# Patient Record
Sex: Female | Born: 1965
Health system: Southern US, Community
[De-identification: ages and names within clinical notes are randomized; demographics above are authoritative.]

## PROBLEM LIST (undated history)

## (undated) DIAGNOSIS — I10 Essential (primary) hypertension: Secondary | ICD-10-CM

## (undated) DIAGNOSIS — K219 Gastro-esophageal reflux disease without esophagitis: Secondary | ICD-10-CM

## (undated) DIAGNOSIS — R112 Nausea with vomiting, unspecified: Secondary | ICD-10-CM

## (undated) HISTORY — PX: TUBAL LIGATION: SHX77

## (undated) HISTORY — PX: CHOLECYSTECTOMY: SHX55

## (undated) HISTORY — PX: WISDOM TOOTH EXTRACTION: SHX21

---

## 1998-10-27 ENCOUNTER — Other Ambulatory Visit: Admission: RE | Admit: 1998-10-27 | Discharge: 1998-10-27 | Payer: Self-pay | Admitting: Obstetrics and Gynecology

## 1999-11-20 ENCOUNTER — Other Ambulatory Visit: Admission: RE | Admit: 1999-11-20 | Discharge: 1999-11-20 | Payer: Self-pay | Admitting: Obstetrics and Gynecology

## 2000-12-21 ENCOUNTER — Other Ambulatory Visit: Admission: RE | Admit: 2000-12-21 | Discharge: 2000-12-21 | Payer: Self-pay | Admitting: Obstetrics and Gynecology

## 2002-02-07 ENCOUNTER — Other Ambulatory Visit: Admission: RE | Admit: 2002-02-07 | Discharge: 2002-02-07 | Payer: Self-pay | Admitting: Obstetrics and Gynecology

## 2003-03-27 ENCOUNTER — Other Ambulatory Visit: Admission: RE | Admit: 2003-03-27 | Discharge: 2003-03-27 | Payer: Self-pay | Admitting: Obstetrics and Gynecology

## 2004-07-31 ENCOUNTER — Other Ambulatory Visit: Admission: RE | Admit: 2004-07-31 | Discharge: 2004-07-31 | Payer: Self-pay | Admitting: Obstetrics and Gynecology

## 2004-08-24 ENCOUNTER — Encounter: Admission: RE | Admit: 2004-08-24 | Discharge: 2004-08-24 | Payer: Self-pay | Admitting: Obstetrics and Gynecology

## 2008-04-23 ENCOUNTER — Ambulatory Visit (HOSPITAL_COMMUNITY): Admission: RE | Admit: 2008-04-23 | Discharge: 2008-04-23 | Payer: Self-pay | Admitting: Obstetrics and Gynecology

## 2010-05-14 LAB — CBC
HCT: 39 % (ref 36.0–46.0)
Hemoglobin: 13.2 g/dL (ref 12.0–15.0)
MCHC: 33.9 g/dL (ref 30.0–36.0)
MCV: 93.8 fL (ref 78.0–100.0)
Platelets: 205 10*3/uL (ref 150–400)
RBC: 4.16 MIL/uL (ref 3.87–5.11)
RDW: 12 % (ref 11.5–15.5)
WBC: 6.9 10*3/uL (ref 4.0–10.5)

## 2010-05-14 LAB — BASIC METABOLIC PANEL
BUN: 9 mg/dL (ref 6–23)
CO2: 28 mEq/L (ref 19–32)
Calcium: 9.3 mg/dL (ref 8.4–10.5)
Chloride: 98 mEq/L (ref 96–112)
Creatinine, Ser: 0.63 mg/dL (ref 0.4–1.2)
GFR calc Af Amer: >60 mL/min (ref >60)
GFR calc non Af Amer: >60 mL/min (ref >60)
Glucose, Bld: 98 mg/dL (ref 70–99)
Potassium: 4.1 mEq/L (ref 3.5–5.1)
Sodium: 133 mEq/L — ABNORMAL LOW (ref 135–145)

## 2010-05-14 LAB — PREGNANCY, URINE: Preg Test, Ur: NEGATIVE

## 2010-06-16 NOTE — H&P (Signed)
NAME:  Jordan Gates, Jordan Gates            ACCOUNT NO.:  [id number]   MEDICAL RECORD NO.:  [id number]          PATIENT TYPE:  AMB   LOCATION:  SDC                           FACILITY:  [facility]   PHYSICIAN:  Duke Salvia. Marcelle Overlie, M.D.DATE OF BIRTH:  1965-07-20   DATE OF ADMISSION:  04/23/2008  DATE OF DISCHARGE:                              HISTORY & PHYSICAL   CHIEF COMPLAINT:  Request permanent sterilization.   HISTORY OF PRESENT ILLNESS:  A 45 year old G2, P2 who recently had a  Mirena IUD removed in the office at the time of hysteroscopy with the  intent to perform Essure at the same time.  It was difficult getting the  IUD out, and there was not good visualization of either tubal ostia  precluding the Essure procedure.  She was given the choice of postponing  the Essure for a different attempt or proceeding with LTC which she  would prefer.  This procedure including the permanence, risks related to  bleeding, infection, and adjacent organ injury with possible need for  open or additional surgery along with the failure rate reviewed which  she understands and accepts.   ALLERGIES:  PENICILLIN.   MEDICATIONS:  Diovan.   OBSTETRICAL HISTORY:  Two vaginal deliveries.   FAMILY HISTORY:  Significant for hypertension and breast cancer.   SOCIAL HISTORY:  Denied smoking or alcohol use.  She is married.  Her  primary doctor is [facility].   PHYSICAL EXAMINATION:  Temp 98.2, blood pressure 108/80.  HEENT:  Unremarkable.  NECK:  Supple without masses.  LUNGS:  Clear.  CARDIOVASCULAR:  Regular rate and rhythm without murmurs, rubs or  gallops.  BREASTS:  Without masses.  ABDOMEN:  Soft, flat, nontender.  PELVIC:  Normal external genitalia.  Vagina and cervix are clear.  Uterus is midposition and normal size.  Adnexa negative.   IMPRESSION:  1. Normal exam, unable to perform Essure procedure.  2. Recent removal of intrauterine device.   PLAN:  Filshie clip tubal procedure  and risks reviewed as above.      Richard M. Marcelle Overlie, M.D.  Electronically Signed     [facility]/MEDQ  D:  04/22/2008  T:  04/22/2008  Job:  [id number]

## 2010-06-16 NOTE — Op Note (Signed)
NAME:  Jordan Gates, Jordan Gates            ACCOUNT NO.:  [id number]   MEDICAL RECORD NO.:  [id number]          PATIENT TYPE:  AMB   LOCATION:  SDC                           FACILITY:  [facility]   PHYSICIAN:  Duke Salvia. Marcelle Overlie, M.D.DATE OF BIRTH:  1965/09/11   DATE OF PROCEDURE:  04/23/2008  DATE OF DISCHARGE:                               OPERATIVE REPORT   PREOPERATIVE DIAGNOSIS:  Requests permanent sterilization.   POSTOPERATIVE DIAGNOSIS:  Requests permanent sterilization.   PROCEDURE:  Filshie clip tubal.   SURGEON:  Duke Salvia. Marcelle Overlie, MD   ANESTHESIA:  General endotracheal.   COMPLICATIONS:  None.   DRAINS:  In and out Foley catheter.   BLOOD LOSS:  Minimal.   SPECIMENS REMOVED:  None.   PROCEDURE AND FINDINGS:  The patient was taken to the operating room.  After an adequate level of general anesthesia was obtained with the  patient's legs in stirrups, the abdomen, the perineum, and vagina were  prepped and draped in the usual manner for laparoscopy.  The bladder was  drained, EUA carried out, uterus was in midposition, normal size,  mobile, adnexa negative.  Kahn cannula was positioned.  Attention  directed to the subumbilical area which was infiltrated with 0.5%  Marcaine plain.  A small incision was made.  The Veress needle was  introduced without difficulty; its intra-abdominal position verified by  pressure and water testing.  A 2.5 liter pneumoperitoneum was then  created.  Laparoscopic trocar and sleeve were then introduced without  difficulty.  There was no evidence of any bleeding or trauma.  Jordan Gates was  placed in Trendelenburg.  The uterus was anteflexed and the pelvic  findings were unremarkable.  A 0.5% Marcaine 4-5 mL were then dripped  across each tube from the cornu to the fimbriated end.  Filshie clip  applicator was back loaded, starting on the right, the Filshie clip was  applied 2 cm to 3 cm from the cornu at a right angle completely  engulfing the tube with  excellent application.  The exact same repeated  on the  opposite side after carefully identifying the tube.  The application was  photo documented.  Instruments were removed, gas allowed to escape.  Defects closed with 4-0 Dexon subcuticular suture.  Jordan Gates tolerated this  well, went to recovery room in good condition.       Richard M. Marcelle Overlie, M.D.  Electronically Signed     [facility]/MEDQ  D:  04/23/2008  T:  04/23/2008  Job:  [id number]

## 2011-02-23 ENCOUNTER — Other Ambulatory Visit: Payer: Self-pay | Admitting: Gastroenterology

## 2011-02-23 DIAGNOSIS — R1013 Epigastric pain: Secondary | ICD-10-CM

## 2011-03-08 ENCOUNTER — Encounter (HOSPITAL_COMMUNITY)
Admission: RE | Admit: 2011-03-08 | Discharge: 2011-03-08 | Disposition: A | Discharge status: Home / Self Care | Payer: BC Managed Care – PPO | Source: Ambulatory Visit | Attending: Gastroenterology | Admitting: Gastroenterology

## 2011-03-08 ENCOUNTER — Encounter (HOSPITAL_COMMUNITY): Payer: Self-pay | Admitting: Emergency Medicine

## 2011-03-08 ENCOUNTER — Emergency Department (HOSPITAL_COMMUNITY)
Admission: EM | Admit: 2011-03-08 | Discharge: 2011-03-09 | Disposition: A | Discharge status: Home / Self Care | Payer: BC Managed Care – PPO | Attending: Emergency Medicine | Admitting: Emergency Medicine

## 2011-03-08 ENCOUNTER — Other Ambulatory Visit: Payer: Self-pay | Admitting: Gastroenterology

## 2011-03-08 ENCOUNTER — Encounter (HOSPITAL_COMMUNITY): Payer: Self-pay

## 2011-03-08 DIAGNOSIS — R1013 Epigastric pain: Secondary | ICD-10-CM

## 2011-03-08 DIAGNOSIS — K819 Cholecystitis, unspecified: Secondary | ICD-10-CM | POA: Insufficient documentation

## 2011-03-08 DIAGNOSIS — R634 Abnormal weight loss: Secondary | ICD-10-CM | POA: Insufficient documentation

## 2011-03-08 DIAGNOSIS — Z79899 Other long term (current) drug therapy: Secondary | ICD-10-CM | POA: Insufficient documentation

## 2011-03-08 DIAGNOSIS — R110 Nausea: Secondary | ICD-10-CM | POA: Insufficient documentation

## 2011-03-08 DIAGNOSIS — R10816 Epigastric abdominal tenderness: Secondary | ICD-10-CM | POA: Insufficient documentation

## 2011-03-08 DIAGNOSIS — I10 Essential (primary) hypertension: Secondary | ICD-10-CM | POA: Insufficient documentation

## 2011-03-08 DIAGNOSIS — R112 Nausea with vomiting, unspecified: Secondary | ICD-10-CM | POA: Insufficient documentation

## 2011-03-08 HISTORY — DX: Essential (primary) hypertension: I10

## 2011-03-08 HISTORY — DX: Nausea with vomiting, unspecified: R11.2

## 2011-03-08 LAB — COMPREHENSIVE METABOLIC PANEL
ALT: 112 U/L — ABNORMAL HIGH (ref 0–35)
AST: 221 U/L — ABNORMAL HIGH (ref 0–37)
Albumin: 4.1 g/dL (ref 3.5–5.2)
Alkaline Phosphatase: 123 U/L — ABNORMAL HIGH (ref 39–117)
BUN: 9 mg/dL (ref 6–23)
CO2: 25 mEq/L (ref 19–32)
Calcium: 9.4 mg/dL (ref 8.4–10.5)
Chloride: 99 mEq/L (ref 96–112)
Creatinine, Ser: 0.75 mg/dL (ref 0.50–1.10)
GFR calc Af Amer: >90 mL/min (ref >90)
GFR calc non Af Amer: >90 mL/min (ref >90)
Glucose, Bld: 124 mg/dL — ABNORMAL HIGH (ref 70–99)
Potassium: 3.5 mEq/L (ref 3.5–5.1)
Sodium: 136 mEq/L (ref 135–145)
Total Bilirubin: 0.8 mg/dL (ref 0.3–1.2)
Total Protein: 7.5 g/dL (ref 6.0–8.3)

## 2011-03-08 LAB — DIFFERENTIAL
Basophils Absolute: 0 10*3/uL (ref 0.0–0.1)
Basophils Relative: 0 % (ref 0–1)
Eosinophils Absolute: 0.1 10*3/uL (ref 0.0–0.7)
Eosinophils Relative: 1 % (ref 0–5)
Lymphocytes Relative: 15 % (ref 12–46)
Lymphs Abs: 1.5 10*3/uL (ref 0.7–4.0)
Monocytes Absolute: 0.8 10*3/uL (ref 0.1–1.0)
Monocytes Relative: 8 % (ref 3–12)
Neutro Abs: 7.8 10*3/uL — ABNORMAL HIGH (ref 1.7–7.7)
Neutrophils Relative %: 77 % (ref 43–77)

## 2011-03-08 LAB — CBC
HCT: 35.8 % — ABNORMAL LOW (ref 36.0–46.0)
Hemoglobin: 12.3 g/dL (ref 12.0–15.0)
MCH: 29.8 pg (ref 26.0–34.0)
MCHC: 34.4 g/dL (ref 30.0–36.0)
MCV: 86.7 fL (ref 78.0–100.0)
Platelets: 192 10*3/uL (ref 150–400)
RBC: 4.13 MIL/uL (ref 3.87–5.11)
RDW: 12.3 % (ref 11.5–15.5)
WBC: 10.1 10*3/uL (ref 4.0–10.5)

## 2011-03-08 IMAGING — US US ABDOMEN COMPLETE
1 series · 13 of 25 positions shown · non-contrast
Comparison: None

CLINICAL DATA: History of epigastric abdominal pain and weight
loss and hypertension.

ABDOMINAL ULTRASOUND COMPLETE

[Series 1: us abdomen complete · 0.30mm/px · 13 of 81 slices shown]
[im 1/81]
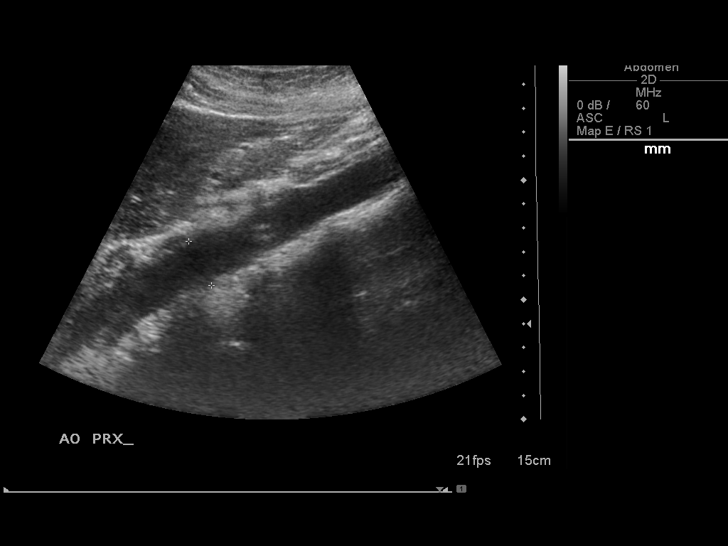
[im 7/81]
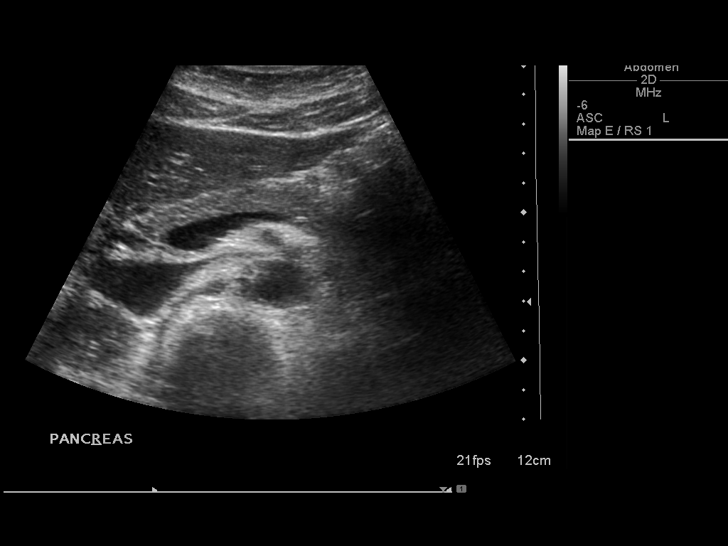
[im 14/81]
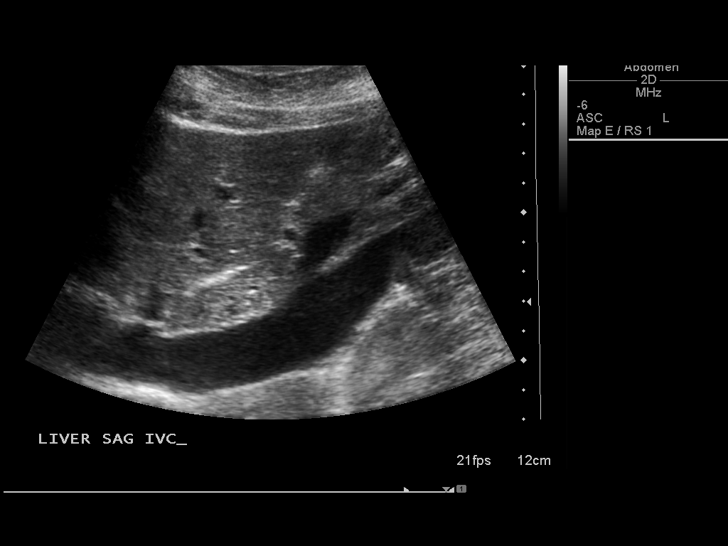
[im 21/81]
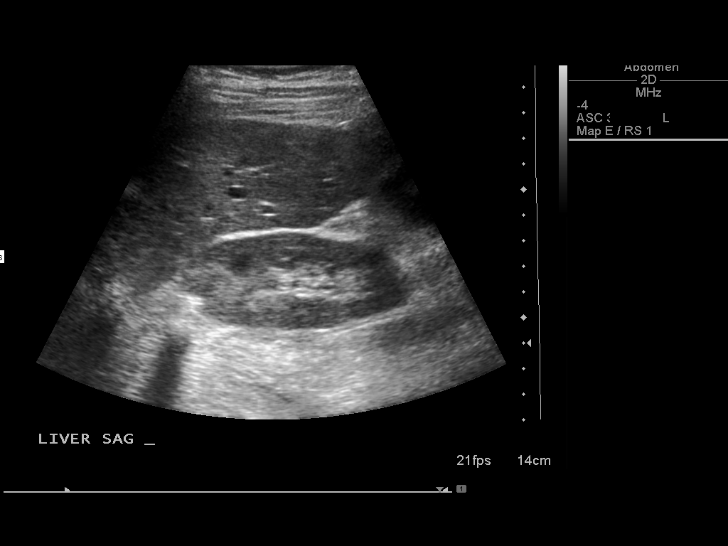
[im 27/81]
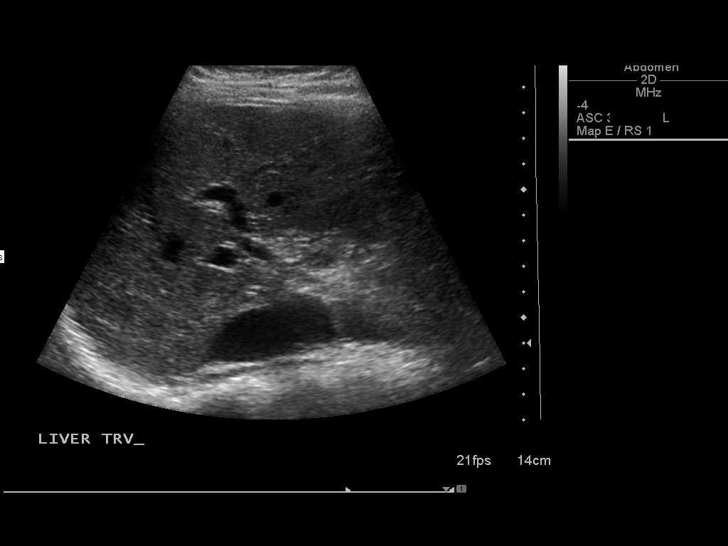
[im 34/81]
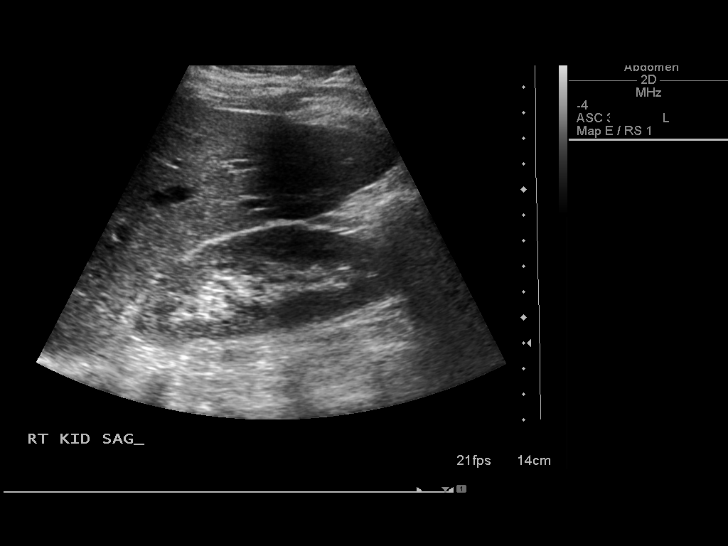
[im 41/81]
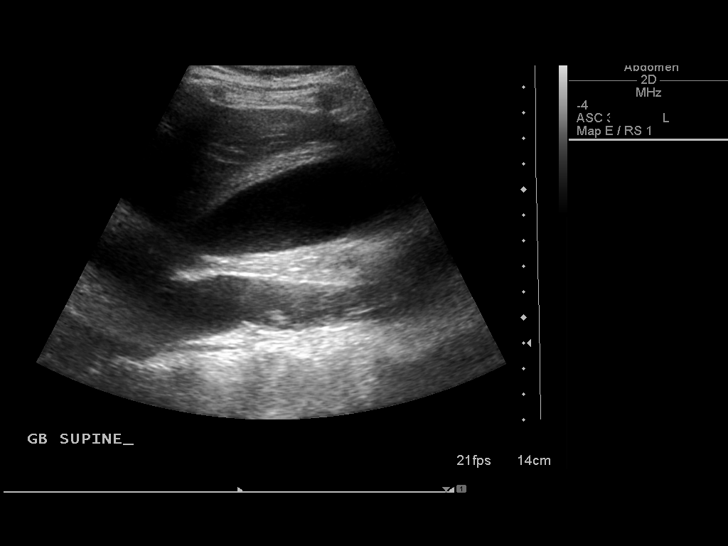
[im 47/81]
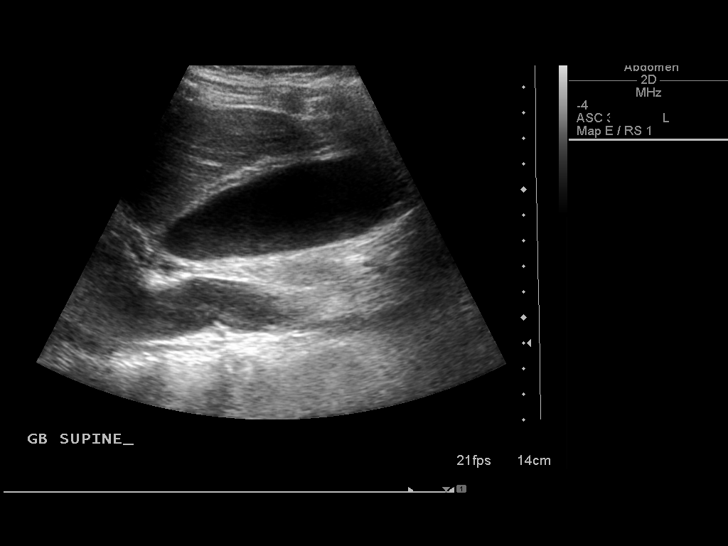
[im 54/81]
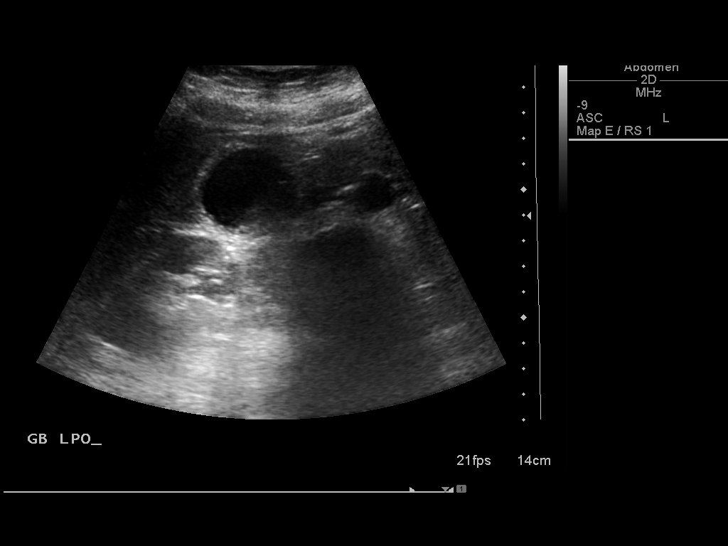
[im 61/81]
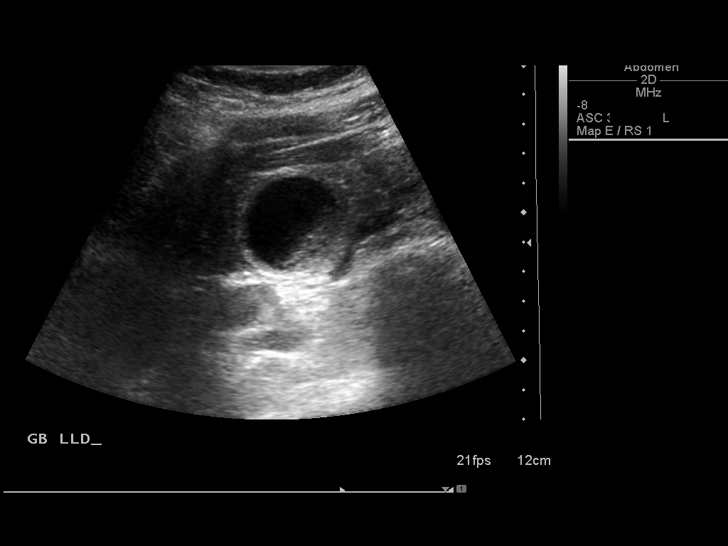
[im 67/81]
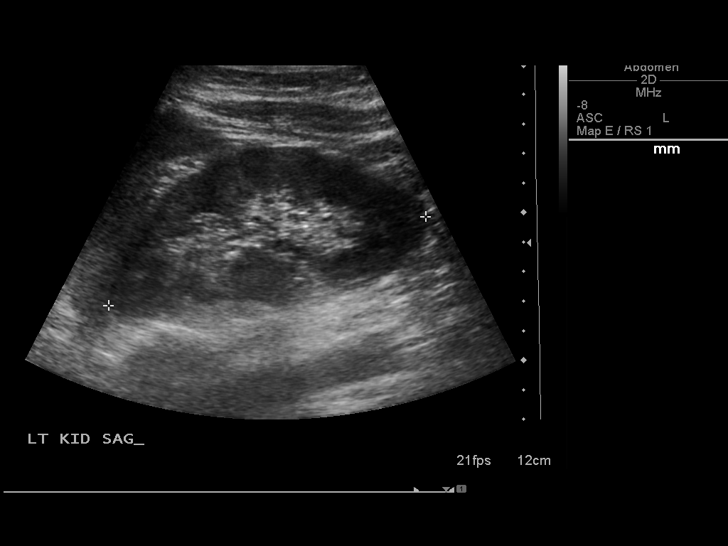
[im 74/81]
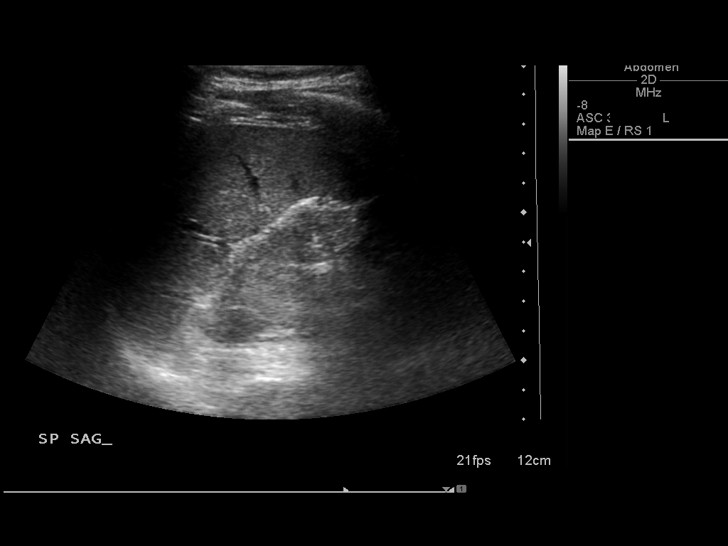
[im 81/81]
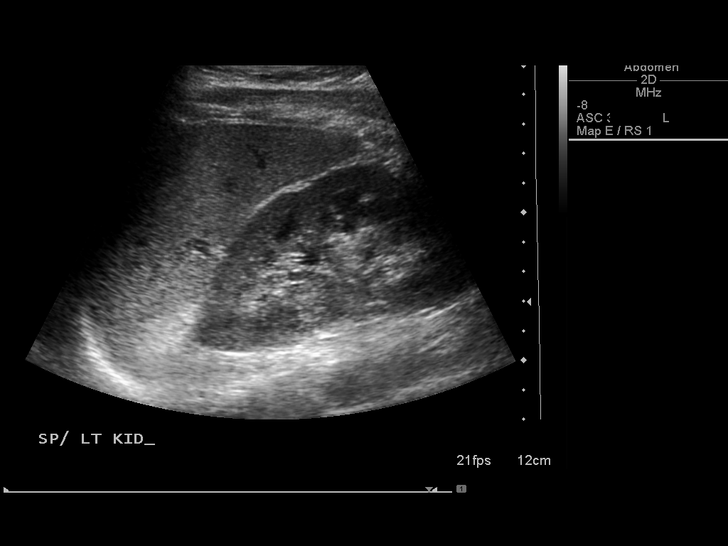

[13 of 25 positions shown; findings below may reference images not displayed]

FINDINGS: Gallbladder: There is echogenic sludge within the gallbladder.
Small hyperechoic echogenic areas are seen within the sludge and
there is suspicion that these may reflect very tiny calculi within
the sludge but no discrete acoustic shadowing posterior to the
small areas of hyperechogenicity was demonstrated to be certain
that calculi are present. There is gallbladder wall thickening.
There is no evidence of pericholecystic fluid. The gallbladder wall
thickness measured 6.5 mm. No positive sonographic Murphy's sign
according to the ultrasound technologist.

CBD: Normal in caliber measuring 5.1 mm. No choledocholithiasis is
evident.

Liver:  Normal size and echotexture without focal parenchymal
abnormality.

IVC:  Patent throughout its visualized course in the abdomen.

Pancreas:  Although the pancreas is difficult to visualize in its
entirety, no focal pancreatic abnormality is identified.

Spleen:  Normal size and echotexture without focal abnormality.
Length is 9 cm.

Right kidney:  No hydronephrosis.  Well-preserved cortex.  Normal
parenchymal echotexture without focal abnormalities.  Right renal
length is 11.1 cm.

Left kidney:  No hydronephrosis.  Well-preserved cortex.  Normal
parenchymal echotexture without focal abnormalities.  Left renal
length is 11.1 cm.

Aorta:  Maximum diameter is 2.1 cm.  No aneurysm is evident.

Ascites:  None.
IMPRESSION: There is thickening of the gallbladder wall.  There was no
positive sonographic Murphy's sign according to the ultrasound
technologist. No pericholecystic fluid is evident.  Echogenic
sludge is present within the gallbladder. Small hyperechoic
echogenic areas are seen within the sludge and there is suspicion
that these may reflect very tiny calculi within the sludge but no
discrete acoustic shadowing posterior to the small areas of
hyperechogenicity was demonstrated to be certain that calculi are
present.

## 2011-03-08 IMAGING — NM NM HEPATOBILIARY IMAGE, INC GB
1 series · 24 of 24 positions shown · non-contrast
Comparison: Ultrasound dated 03/08/2011

CLINICAL DATA: Epigastric pain. Nausea and vomiting.

NUCLEAR MEDICINE HEPATOHBILIARY INCLUDE GB
Radiopharmaceutical: 520 mCi technetium 99m Choletec.
2.7 mg morphine IV

[Series 1: antr · 4.46mm/px · 4 acquisitions, 24 frames shown]
[im 1/4]
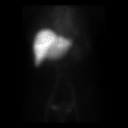
[im 1/4]
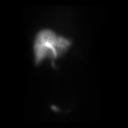
[im 1/4]
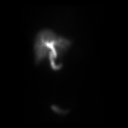
[im 1/4]
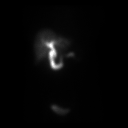
[im 1/4]
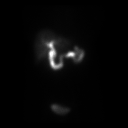
[im 1/4]
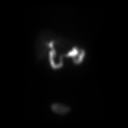
[im 2/4]
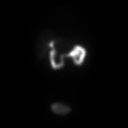
[im 2/4]
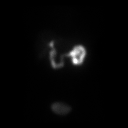
[im 2/4]
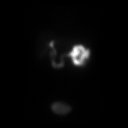
[im 2/4]
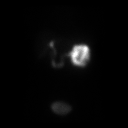
[im 2/4]
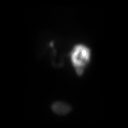
[im 2/4]
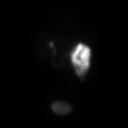
[im 3/4]
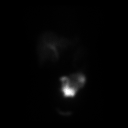
[im 3/4]
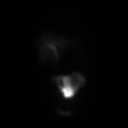
[im 3/4]
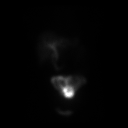
[im 3/4]
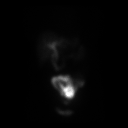
[im 3/4]
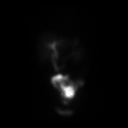
[im 3/4]
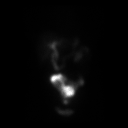
[im 4/4]
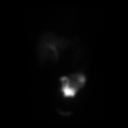
[im 4/4]
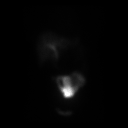
[im 4/4]
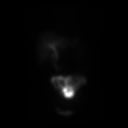
[im 4/4]
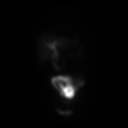
[im 4/4]
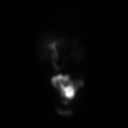
[im 4/4]
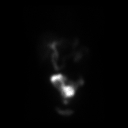

[24 of 24 positions shown; findings below may reference images not displayed]

FINDINGS: The patient was imaged for 90 minutes and activity was
seen in the bile ducts and duodenum at 15 minutes.  The gallbladder
was not visualized at 90 minutes.  The patient was then given
mg of morphine IV.  The gallbladder was not visualized at an
additional 30 minutes.
IMPRESSION: Abnormal hepatobiliary scan with no activity in the gallbladder
over a 2 hour time interval even after IV morphine. The findings
are consistent with cholecystitis, most likely acute.  Chronic
cholecystitis can sometimes also give this appearance.

## 2011-03-08 MED ORDER — MORPHINE SULFATE 4 MG/ML IJ SOLN
2.7000 mg | Freq: Once | INTRAMUSCULAR | Status: AC
  Administered 2011-03-08: 2.7 mg via INTRAVENOUS
  Filled 2011-03-08: qty 1

## 2011-03-08 MED ORDER — TECHNETIUM TC 99M MEBROFENIN IV KIT
5.8000 | PACK | Freq: Once | INTRAVENOUS | Status: AC | PRN
  Administered 2011-03-08: 5.8 via INTRAVENOUS

## 2011-03-08 MED ORDER — MORPHINE SULFATE 4 MG/ML IJ SOLN
4.0000 mg | Freq: Once | INTRAMUSCULAR | Status: AC
  Administered 2011-03-09: 4 mg via INTRAVENOUS
  Filled 2011-03-08: qty 1

## 2011-03-08 MED ORDER — SODIUM CHLORIDE 0.9 % IV BOLUS (SEPSIS)
1000.0000 mL | Freq: Once | INTRAVENOUS | Status: AC
  Administered 2011-03-09: 1000 mL via INTRAVENOUS

## 2011-03-08 NOTE — ED Notes (Signed)
Pt states she is having sharp pain in her midepigastric area that is radiating through to her back  Pt states she had 2 procedures today and has an appt with Dr Abbey Chatters tomorrow but the pain is worse tonight and she is unable to wait til tomorrow  Pt states the scan she had today shows her gallbladder is not functioning

## 2011-03-09 ENCOUNTER — Ambulatory Visit (INDEPENDENT_AMBULATORY_CARE_PROVIDER_SITE_OTHER): Payer: Self-pay | Admitting: General Surgery

## 2011-03-09 ENCOUNTER — Other Ambulatory Visit (INDEPENDENT_AMBULATORY_CARE_PROVIDER_SITE_OTHER): Payer: Self-pay | Admitting: General Surgery

## 2011-03-09 DIAGNOSIS — K819 Cholecystitis, unspecified: Secondary | ICD-10-CM

## 2011-03-09 DIAGNOSIS — R945 Abnormal results of liver function studies: Secondary | ICD-10-CM

## 2011-03-09 DIAGNOSIS — R1013 Epigastric pain: Secondary | ICD-10-CM

## 2011-03-09 LAB — URINALYSIS, ROUTINE W REFLEX MICROSCOPIC
Bilirubin Urine: NEGATIVE
Glucose, UA: NEGATIVE mg/dL
Hgb urine dipstick: NEGATIVE
Ketones, ur: 15 mg/dL — AB
Leukocytes, UA: NEGATIVE
Nitrite: NEGATIVE
Protein, ur: NEGATIVE mg/dL
Specific Gravity, Urine: 1.016 (ref 1.005–1.030)
Urobilinogen, UA: 1 mg/dL (ref 0.0–1.0)
pH: 6 (ref 5.0–8.0)

## 2011-03-09 LAB — PROTIME-INR
INR: 1.05 (ref 0.00–1.49)
Prothrombin Time: 13.9 seconds (ref 11.6–15.2)

## 2011-03-09 LAB — LACTIC ACID, PLASMA: Lactic Acid, Venous: 0.9 mmol/L (ref 0.5–2.2)

## 2011-03-09 LAB — POCT PREGNANCY, URINE: Preg Test, Ur: NEGATIVE

## 2011-03-09 LAB — LIPASE, BLOOD: Lipase: 48 U/L (ref 11–59)

## 2011-03-09 MED ORDER — OXYCODONE-ACETAMINOPHEN 5-325 MG PO TABS: 2.0000 | ORAL_TABLET | Freq: Once | ORAL | Status: DC

## 2011-03-09 MED ORDER — ONDANSETRON 8 MG PO TBDP: 8.0000 mg | ORAL_TABLET | Freq: Three times a day (TID) | ORAL | Status: AC | PRN

## 2011-03-09 MED ORDER — OXYCODONE-ACETAMINOPHEN 5-325 MG PO TABS: 2.0000 | ORAL_TABLET | Freq: Four times a day (QID) | ORAL | Status: AC | PRN

## 2011-03-09 NOTE — ED Provider Notes (Signed)
History     CSN: [id number]  Arrival date & time 03/08/11  2039   First MD Initiated Contact with Patient 03/08/11 2304      Chief Complaint  Patient presents with  . Abdominal Pain    (Consider location/radiation/quality/duration/timing/severity/associated sxs/prior treatment) HPI Patient is a 46 yo F who presents complaining of 6/10 epigastric pain that radiates to the RUQ and back.  She has been suffering intermittent episodes of this for the past few weeks.  This is worse with eating.  She was seen today for a HIDA scan ordered by her physician to evaluate for cholecystitis.  She has a consultation appointment with Dr. Purnell Shoemaker of [facility] tomorrow for this.  Study showed findings consistent with cholecystitis.  She has been afebrile and nauseas but is not vomiting.  She denies She has history of tubal ligation and no other abdominal surgeries.  The pain is worse with palpation.  There are no other associated or modifying factors. Past Medical History  Diagnosis Date  . N&V (nausea and vomiting)     n/v  epigastric pain  . Hypertension     Past Surgical History  Procedure Date  . Tubal ligation     Family History  Problem Relation Age of Onset  . Hypertension Mother   . Cancer Mother     History  Substance Use Topics  . Smoking status: Never Smoker   . Smokeless tobacco: Not on file  . Alcohol Use: No    OB History    Grav Para Term Preterm Abortions TAB SAB Ect Mult Living                  Review of Systems  Constitutional: Negative.   HENT: Negative.   Eyes: Negative.   Respiratory: Negative.   Cardiovascular: Negative.   Gastrointestinal: Positive for nausea and abdominal pain.  Genitourinary: Negative.   Musculoskeletal: Negative.   Skin: Negative.   Neurological: Negative.   Hematological: Negative.   Psychiatric/Behavioral: Negative.   All other systems reviewed and are negative.    Allergies  Penicillins and Sulfa antibiotics  Home  Medications   Current Outpatient Rx  Name Route Sig Dispense Refill  . DEXLANSOPRAZOLE 60 MG PO CPDR Oral Take 60 mg by mouth daily.    Marland Kitchen ONE-DAILY MULTI VITAMINS PO TABS Oral Take 1 tablet by mouth daily.    Marland Kitchen VALSARTAN 160 MG PO TABS Oral Take 160 mg by mouth daily.      BP 134/49  Pulse 76  Temp(Src) 98.4 F (36.9 C) (Oral)  Resp 20  SpO2 96%  LMP 02/14/2011  Physical Exam  Nursing note and vitals reviewed. Constitutional: She is oriented to person, place, and time. She appears well-developed and well-nourished. No distress.       uncomfortable  HENT:  Head: Normocephalic and atraumatic.  Eyes: Conjunctivae and EOM are normal. Pupils are equal, round, and reactive to light.  Neck: Normal range of motion.  Cardiovascular: Normal rate, regular rhythm, normal heart sounds and intact distal pulses.  Exam reveals no gallop and no friction rub.   No murmur heard. Pulmonary/Chest: Effort normal and breath sounds normal. No respiratory distress. She has no wheezes. She has no rales.  Abdominal: Soft. Bowel sounds are normal. She exhibits no distension. There is tenderness. There is no rebound and no guarding.       TTP in the epigastrium that is moderate, no acute abdomen  Musculoskeletal: Normal range of motion. She exhibits no edema.  Neurological: She is alert and oriented to person, place, and time. No cranial nerve deficit. She exhibits normal muscle tone. Coordination normal.  Skin: Skin is warm and dry. No rash noted.  Psychiatric: She has a normal mood and affect.    ED Course  Procedures (including critical care time)  Labs Reviewed  CBC - Abnormal; Notable for the following:    HCT 35.8 (*)    All other components within normal limits  DIFFERENTIAL - Abnormal; Notable for the following:    Neutro Abs 7.8 (*)    All other components within normal limits  COMPREHENSIVE METABOLIC PANEL - Abnormal; Notable for the following:    Glucose, Bld 124 (*)    AST 221 (*)     ALT 112 (*)    Alkaline Phosphatase 123 (*)    All other components within normal limits  URINALYSIS, ROUTINE W REFLEX MICROSCOPIC - Abnormal; Notable for the following:    Ketones, ur 15 (*)    All other components within normal limits  PROTIME-INR  LIPASE, BLOOD  LACTIC ACID, PLASMA   Nm Hepatobiliary Liver Func  03/08/2011  *RADIOLOGY REPORT*  Clinical Data: Epigastric pain. Nausea and vomiting.  NUCLEAR MEDICINE HEPATOHBILIARY INCLUDE GB  Radiopharmaceutical: 520 mCi technetium 53m Choletec.  2.7 mg morphine IV  Comparison: Ultrasound dated 03/08/2011  Findings: The patient was imaged for 90 minutes and activity was seen in the bile ducts and duodenum at 15 minutes.  The gallbladder was not visualized at 90 minutes.  The patient was then given 2.7 mg of morphine IV.  The gallbladder was not visualized at an additional 30 minutes.  IMPRESSION: Abnormal hepatobiliary scan with no activity in the gallbladder over a 2 hour time interval even after IV morphine. The findings are consistent with cholecystitis, most likely acute.  Chronic cholecystitis can sometimes also give this appearance.  Original Report Authenticated By: Gwynn Burly, M.D.   US Abdomen Complete  03/08/2011  *RADIOLOGY REPORT*  Clinical Data:  History of epigastric abdominal pain and weight loss and hypertension.  ABDOMINAL ULTRASOUND COMPLETE  Comparison:  None  Findings:  Gallbladder: There is echogenic sludge within the gallbladder. Small hyperechoic echogenic areas are seen within the sludge and there is suspicion that these may reflect very tiny calculi within the sludge but no discrete acoustic shadowing posterior to the small areas of hyperechogenicity was demonstrated to be certain that calculi are present. There is gallbladder wall thickening. There is no evidence of pericholecystic fluid. The gallbladder wall thickness measured 6.5 mm. No positive sonographic Murphy's sign according to the ultrasound technologist.  CBD:  Normal in caliber measuring 5.1 mm. No choledocholithiasis is evident.  Liver:  Normal size and echotexture without focal parenchymal abnormality.  IVC:  Patent throughout its visualized course in the abdomen.  Pancreas:  Although the pancreas is difficult to visualize in its entirety, no focal pancreatic abnormality is identified.  Spleen:  Normal size and echotexture without focal abnormality. Length is 9 cm.  Right kidney:  No hydronephrosis.  Well-preserved cortex.  Normal parenchymal echotexture without focal abnormalities.  Right renal length is 11.1 cm.  Left kidney:  No hydronephrosis.  Well-preserved cortex.  Normal parenchymal echotexture without focal abnormalities.  Left renal length is 11.1 cm.  Aorta:  Maximum diameter is 2.1 cm.  No aneurysm is evident.  Ascites:  None.  IMPRESSION:   There is thickening of the gallbladder wall.  There was no positive sonographic Murphy's sign according to the ultrasound technologist. No  pericholecystic fluid is evident.  Echogenic sludge is present within the gallbladder. Small hyperechoic echogenic areas are seen within the sludge and there is suspicion that these may reflect very tiny calculi within the sludge but no discrete acoustic shadowing posterior to the small areas of hyperechogenicity was demonstrated to be certain that calculi are present.  Original Report Authenticated By: Crawford Givens, M.D.     1. Cholecystitis       MDM  Patient was evaluated and was hemodynamically stable without an acute abdomen.  Labs were ordered and IVF and a dose of morphine were given.  PAin resolved with this.  Patient labs showed no leukocytosis but she did have elevated transaminases with no history of liver abnormalities previously.  I spoke to Dr. Johna Sheriff who was on call for [facility].  He evaluated the patient here and wrote consult note indicating that he felt the patient did have a cholecystitis but that the acuity of this was debatable.  Patient will be scheduled for  surgery later in the week.  She was given strict precautions for reasons to return by myself including worsening pain and vomiting, fevers, inability to tolerate po.  She was discharged with pain and nausea medications.  Patient and family understood the plan and patient was discharged in good condition.        Cyndra Numbers, MD 03/09/11 [phone]

## 2011-03-09 NOTE — Consult Note (Signed)
Reason for Consult: Abdominal Pain Referring Physician: Suraiya Dickerson Altvater is an 46 y.o. female.  HPI: the patient is a 46 year old female who presents to the emergency room with acute abdominal pain. The patient has an intentional weight loss of 70 pounds over the end of 2012. At that time she began to experience intermittent epigastric abdominal pain. Initially this was not very frequent or severe. Over the past 4-6 weeks she has been experiencing increasingly frequent and severe episodes of abdominal pain. She has been evaluated by Dr. Ranae Palms.  She was seen 2 days ago after a self limited attack of pain. At that point an abdominal ultrasound and HIDA scan were obtained. As below her ultrasound showed significant thickening of the gallbladder wall and sludge or probable small stones. HIDA scan yesterday showed nonvisualization of the gallbladder. Last night she again had a fairly sudden onset of severe epigastric pain radiating through to her back associated with nausea. She then presented to the emergency room for evaluation. Lab work has shown mildly elevated LFTs as noted below. She received 1 dose of IV narcotics in the emergency room 6 hours ago and now her pain is completely relieved. I was asked to see the patient in consultation. She did have an appointment to be seen in our office today by Dr. Abbey Chatters.  She has not had any fever or chills. No dark urine or yellow color to her eyes or skin. She feels essentially well between episodes of pain. Bowel movements have been normal without melena or hematochezia.  Past Medical History  Diagnosis Date  . N&V (nausea and vomiting)     n/v  epigastric pain  . Hypertension     Past Surgical History  Procedure Date  . Tubal ligation     Family History  Problem Relation Age of Onset  . Hypertension Mother   . Cancer Mother     Social History:  reports that she has never smoked. She does not have any smokeless tobacco history on  file. She reports that she does not drink alcohol or use illicit drugs.  Allergies:  Allergies  Allergen Reactions  . Penicillins   . Sulfa Antibiotics     Current Facility-Administered Medications  Medication Dose Route Frequency Provider Last Rate Last Dose  . morphine 4 MG/ML injection 4 mg  4 mg Intravenous Once Cyndra Numbers, MD   4 mg at 03/09/11 0019  . oxyCODONE-acetaminophen (PERCOCET) 5-325 MG per tablet 2 tablet  2 tablet Oral Once Meagan Hunt, MD      . sodium chloride 0.9 % bolus 1,000 mL  1,000 mL Intravenous Once Cyndra Numbers, MD   1,000 mL at 03/09/11 0000   Current Outpatient Prescriptions  Medication Sig Dispense Refill  . dexlansoprazole (DEXILANT) 60 MG capsule Take 60 mg by mouth daily.      . Multiple Vitamin (MULTIVITAMIN) tablet Take 1 tablet by mouth daily.      . valsartan (DIOVAN) 160 MG tablet Take 160 mg by mouth daily.      . ondansetron (ZOFRAN ODT) 8 MG disintegrating tablet Take 1 tablet (8 mg total) by mouth every 8 (eight) hours as needed for nausea.  20 tablet  0  . oxyCODONE-acetaminophen (PERCOCET) 5-325 MG per tablet Take 2 tablets by mouth every 6 (six) hours as needed for pain.  30 tablet  0   Facility-Administered Medications Ordered in Other Encounters  Medication Dose Route Frequency Provider Last Rate Last Dose  . morphine 4  MG/ML injection 2.7 mg  2.7 mg Intravenous Once Gerrianne Scale, MD   2.7 mg at 03/08/11 1245  . technetium TC 58M mebrofenin (CHOLETEC) injection 6 milli Curie  6 milli Curie Intravenous Once PRN Medication Radiologist, MD   5.8 milli Curie at 03/08/11 1000     Results for orders placed during the hospital encounter of 03/08/11 (from the past 48 hour(s))  CBC     Status: Abnormal   Collection Time   03/08/11 10:50 PM      Component Value Range Comment   WBC 10.1  4.0 - 10.5 (K/uL)    RBC 4.13  3.87 - 5.11 (MIL/uL)    Hemoglobin 12.3  12.0 - 15.0 (g/dL)    HCT 16.1 (*) 09.6 - 46.0 (%)    MCV 86.7  78.0 - 100.0 (fL)      MCH 29.8  26.0 - 34.0 (pg)    MCHC 34.4  30.0 - 36.0 (g/dL)    RDW 04.5  40.9 - 81.1 (%)    Platelets 192  150 - 400 (K/uL)   DIFFERENTIAL     Status: Abnormal   Collection Time   03/08/11 10:50 PM      Component Value Range Comment   Neutrophils Relative 77  43 - 77 (%)    Neutro Abs 7.8 (*) 1.7 - 7.7 (K/uL)    Lymphocytes Relative 15  12 - 46 (%)    Lymphs Abs 1.5  0.7 - 4.0 (K/uL)    Monocytes Relative 8  3 - 12 (%)    Monocytes Absolute 0.8  0.1 - 1.0 (K/uL)    Eosinophils Relative 1  0 - 5 (%)    Eosinophils Absolute 0.1  0.0 - 0.7 (K/uL)    Basophils Relative 0  0 - 1 (%)    Basophils Absolute 0.0  0.0 - 0.1 (K/uL)   COMPREHENSIVE METABOLIC PANEL     Status: Abnormal   Collection Time   03/08/11 10:50 PM      Component Value Range Comment   Sodium 136  135 - 145 (mEq/L)    Potassium 3.5  3.5 - 5.1 (mEq/L)    Chloride 99  96 - 112 (mEq/L)    CO2 25  19 - 32 (mEq/L)    Glucose, Bld 124 (*) 70 - 99 (mg/dL)    BUN 9  6 - 23 (mg/dL)    Creatinine, Ser 9.14  0.50 - 1.10 (mg/dL)    Calcium 9.4  8.4 - 10.5 (mg/dL)    Total Protein 7.5  6.0 - 8.3 (g/dL)    Albumin 4.1  3.5 - 5.2 (g/dL)    AST 782 (*) 0 - 37 (U/L)    ALT 112 (*) 0 - 35 (U/L)    Alkaline Phosphatase 123 (*) 39 - 117 (U/L)    Total Bilirubin 0.8  0.3 - 1.2 (mg/dL)    GFR calc non Af Amer >90  >90 (mL/min)    GFR calc Af Amer >90  >90 (mL/min)   PROTIME-INR     Status: Normal   Collection Time   03/08/11 10:50 PM      Component Value Range Comment   Prothrombin Time 13.9  11.6 - 15.2 (seconds)    INR 1.05  0.00 - 1.49    LIPASE, BLOOD     Status: Normal   Collection Time   03/08/11 10:59 PM      Component Value Range Comment   Lipase 48  11 - 59 (  U/L)   LACTIC ACID, PLASMA     Status: Normal   Collection Time   03/08/11 11:30 PM      Component Value Range Comment   Lactic Acid, Venous 0.9  0.5 - 2.2 (mmol/L)   URINALYSIS, ROUTINE W REFLEX MICROSCOPIC     Status: Abnormal   Collection Time   03/08/11 11:51 PM       Component Value Range Comment   Color, Urine YELLOW  YELLOW     APPearance CLEAR  CLEAR     Specific Gravity, Urine 1.016  1.005 - 1.030     pH 6.0  5.0 - 8.0     Glucose, UA NEGATIVE  NEGATIVE (mg/dL)    Hgb urine dipstick NEGATIVE  NEGATIVE     Bilirubin Urine NEGATIVE  NEGATIVE     Ketones, ur 15 (*) NEGATIVE (mg/dL)    Protein, ur NEGATIVE  NEGATIVE (mg/dL)    Urobilinogen, UA 1.0  0.0 - 1.0 (mg/dL)    Nitrite NEGATIVE  NEGATIVE     Leukocytes, UA NEGATIVE  NEGATIVE  MICROSCOPIC NOT DONE ON URINES WITH NEGATIVE PROTEIN, BLOOD, LEUKOCYTES, NITRITE, OR GLUCOSE <1000 mg/dL.    Nm Hepatobiliary Liver Func  03/08/2011  *RADIOLOGY REPORT*  Clinical Data: Epigastric pain. Nausea and vomiting.  NUCLEAR MEDICINE HEPATOHBILIARY INCLUDE GB  Radiopharmaceutical: 520 mCi technetium 71m Choletec.  2.7 mg morphine IV  Comparison: Ultrasound dated 03/08/2011  Findings: The patient was imaged for 90 minutes and activity was seen in the bile ducts and duodenum at 15 minutes.  The gallbladder was not visualized at 90 minutes.  The patient was then given 2.7 mg of morphine IV.  The gallbladder was not visualized at an additional 30 minutes.  IMPRESSION: Abnormal hepatobiliary scan with no activity in the gallbladder over a 2 hour time interval even after IV morphine. The findings are consistent with cholecystitis, most likely acute.  Chronic cholecystitis can sometimes also give this appearance.  Original Report Authenticated By: Gwynn Burly, M.D.   US Abdomen Complete  03/08/2011  *RADIOLOGY REPORT*  Clinical Data:  History of epigastric abdominal pain and weight loss and hypertension.  ABDOMINAL ULTRASOUND COMPLETE  Comparison:  None  Findings:  Gallbladder: There is echogenic sludge within the gallbladder. Small hyperechoic echogenic areas are seen within the sludge and there is suspicion that these may reflect very tiny calculi within the sludge but no discrete acoustic shadowing posterior to the  small areas of hyperechogenicity was demonstrated to be certain that calculi are present. There is gallbladder wall thickening. There is no evidence of pericholecystic fluid. The gallbladder wall thickness measured 6.5 mm. No positive sonographic Murphy's sign according to the ultrasound technologist.  CBD: Normal in caliber measuring 5.1 mm. No choledocholithiasis is evident.  Liver:  Normal size and echotexture without focal parenchymal abnormality.  IVC:  Patent throughout its visualized course in the abdomen.  Pancreas:  Although the pancreas is difficult to visualize in its entirety, no focal pancreatic abnormality is identified.  Spleen:  Normal size and echotexture without focal abnormality. Length is 9 cm.  Right kidney:  No hydronephrosis.  Well-preserved cortex.  Normal parenchymal echotexture without focal abnormalities.  Right renal length is 11.1 cm.  Left kidney:  No hydronephrosis.  Well-preserved cortex.  Normal parenchymal echotexture without focal abnormalities.  Left renal length is 11.1 cm.  Aorta:  Maximum diameter is 2.1 cm.  No aneurysm is evident.  Ascites:  None.  IMPRESSION:   There is thickening of the  gallbladder wall.  There was no positive sonographic Murphy's sign according to the ultrasound technologist. No pericholecystic fluid is evident.  Echogenic sludge is present within the gallbladder. Small hyperechoic echogenic areas are seen within the sludge and there is suspicion that these may reflect very tiny calculi within the sludge but no discrete acoustic shadowing posterior to the small areas of hyperechogenicity was demonstrated to be certain that calculi are present.  Original Report Authenticated By: Crawford Givens, M.D.    Review of Systems  Constitutional: Positive for weight loss. Negative for fever and chills.  HENT: Negative.   Respiratory: Negative for cough, sputum production and shortness of breath.   Cardiovascular: Negative for chest pain and palpitations.   Gastrointestinal: Positive for nausea, vomiting and abdominal pain. Negative for heartburn, diarrhea, constipation and blood in stool.  Genitourinary: Negative for dysuria, urgency and frequency.  Musculoskeletal: Negative for myalgias and joint pain.  Endo/Heme/Allergies: Does not bruise/bleed easily.   Blood pressure 107/66, pulse 50, temperature 97.9 F (36.6 C), temperature source Oral, resp. rate 20, last menstrual period 02/14/2011, SpO2 96.00%. Physical Exam General: Alert, well-developed Caucasian female, in no distress Skin: Warm and dry without rash or infection. HEENT: No palpable masses or thyromegaly. Sclera nonicteric. Pupils equal round and reactive. Oropharynx clear. Lymph nodes: No cervical, supraclavicular, or inguinal nodes palpable. Lungs: Breath sounds clear and equal without increased work of breathing Cardiovascular: Regular rate and rhythm without murmur. No JVD or edema. Peripheral pulses intact. Abdomen: Nondistended. Soft with minimal epigastric tenderness to deep palpation and no guarding.. No masses palpable. No organomegaly. No palpable hernias. Extremities: No edema or joint swelling or deformity. No chronic venous stasis changes. Neurologic: Alert and fully oriented..  Assessment/Plan: Repeated and worsening episodes of epigastric abdominal pain. Gallbladder ultrasound shows significant wall thickening and probable small stones. HIDA was positive for nonvisualization of the gallbladder. She has mildly elevated LFTs consistent with inflammatory change versus less likely common bile duct stones. Currently the patient is completely asymptomatic. I suspect that her HIDA scan result is due to significant chronic cholecystitis rather than acute cholecystitis as she does not have any significant pain or tenderness currently and no fever or elevated white blood count. We discussed options of immediate admission versus urgent elective cholecystectomy. As she is currently  feeling well we will plan to let her go home and we will arrange outpatient laparoscopic cholecystectomy for later this week. We discussed the nature of the procedure and its indications as well as risks of bleeding, infection, bile leak, bile duct injury and possible need for open procedure.  Jerrelle Michelsen T 03/09/2011, 4:41 AM

## 2011-03-10 ENCOUNTER — Ambulatory Visit (HOSPITAL_COMMUNITY)
Admission: RE | Admit: 2011-03-10 | Discharge: 2011-03-10 | Disposition: A | Discharge status: Home / Self Care | Payer: BC Managed Care – PPO | Source: Ambulatory Visit | Attending: General Surgery | Admitting: General Surgery

## 2011-03-10 ENCOUNTER — Other Ambulatory Visit: Payer: Self-pay

## 2011-03-10 ENCOUNTER — Encounter (HOSPITAL_COMMUNITY)
Admission: RE | Disposition: A | Discharge status: Home / Self Care | Payer: Self-pay | Source: Ambulatory Visit | Attending: General Surgery

## 2011-03-10 ENCOUNTER — Ambulatory Visit (HOSPITAL_COMMUNITY): Payer: BC Managed Care – PPO | Admitting: Anesthesiology

## 2011-03-10 ENCOUNTER — Ambulatory Visit (HOSPITAL_COMMUNITY): Payer: BC Managed Care – PPO

## 2011-03-10 ENCOUNTER — Encounter (HOSPITAL_COMMUNITY): Payer: Self-pay | Admitting: Anesthesiology

## 2011-03-10 ENCOUNTER — Encounter (HOSPITAL_COMMUNITY): Payer: Self-pay | Admitting: *Deleted

## 2011-03-10 DIAGNOSIS — R109 Unspecified abdominal pain: Secondary | ICD-10-CM | POA: Insufficient documentation

## 2011-03-10 DIAGNOSIS — K824 Cholesterolosis of gallbladder: Secondary | ICD-10-CM

## 2011-03-10 DIAGNOSIS — K8010 Calculus of gallbladder with chronic cholecystitis without obstruction: Secondary | ICD-10-CM

## 2011-03-10 DIAGNOSIS — Z79899 Other long term (current) drug therapy: Secondary | ICD-10-CM | POA: Insufficient documentation

## 2011-03-10 DIAGNOSIS — I10 Essential (primary) hypertension: Secondary | ICD-10-CM | POA: Insufficient documentation

## 2011-03-10 HISTORY — PX: CHOLECYSTECTOMY: SHX55

## 2011-03-10 LAB — SURGICAL PCR SCREEN
MRSA, PCR: NEGATIVE
Staphylococcus aureus: NEGATIVE

## 2011-03-10 IMAGING — CR DG CHEST 2V
2 series · 2 of 2 positions shown · non-contrast
Comparison: None.

CLINICAL DATA: Preop lap cholecystectomy

CHEST - 2 VIEW

[w chest pa]
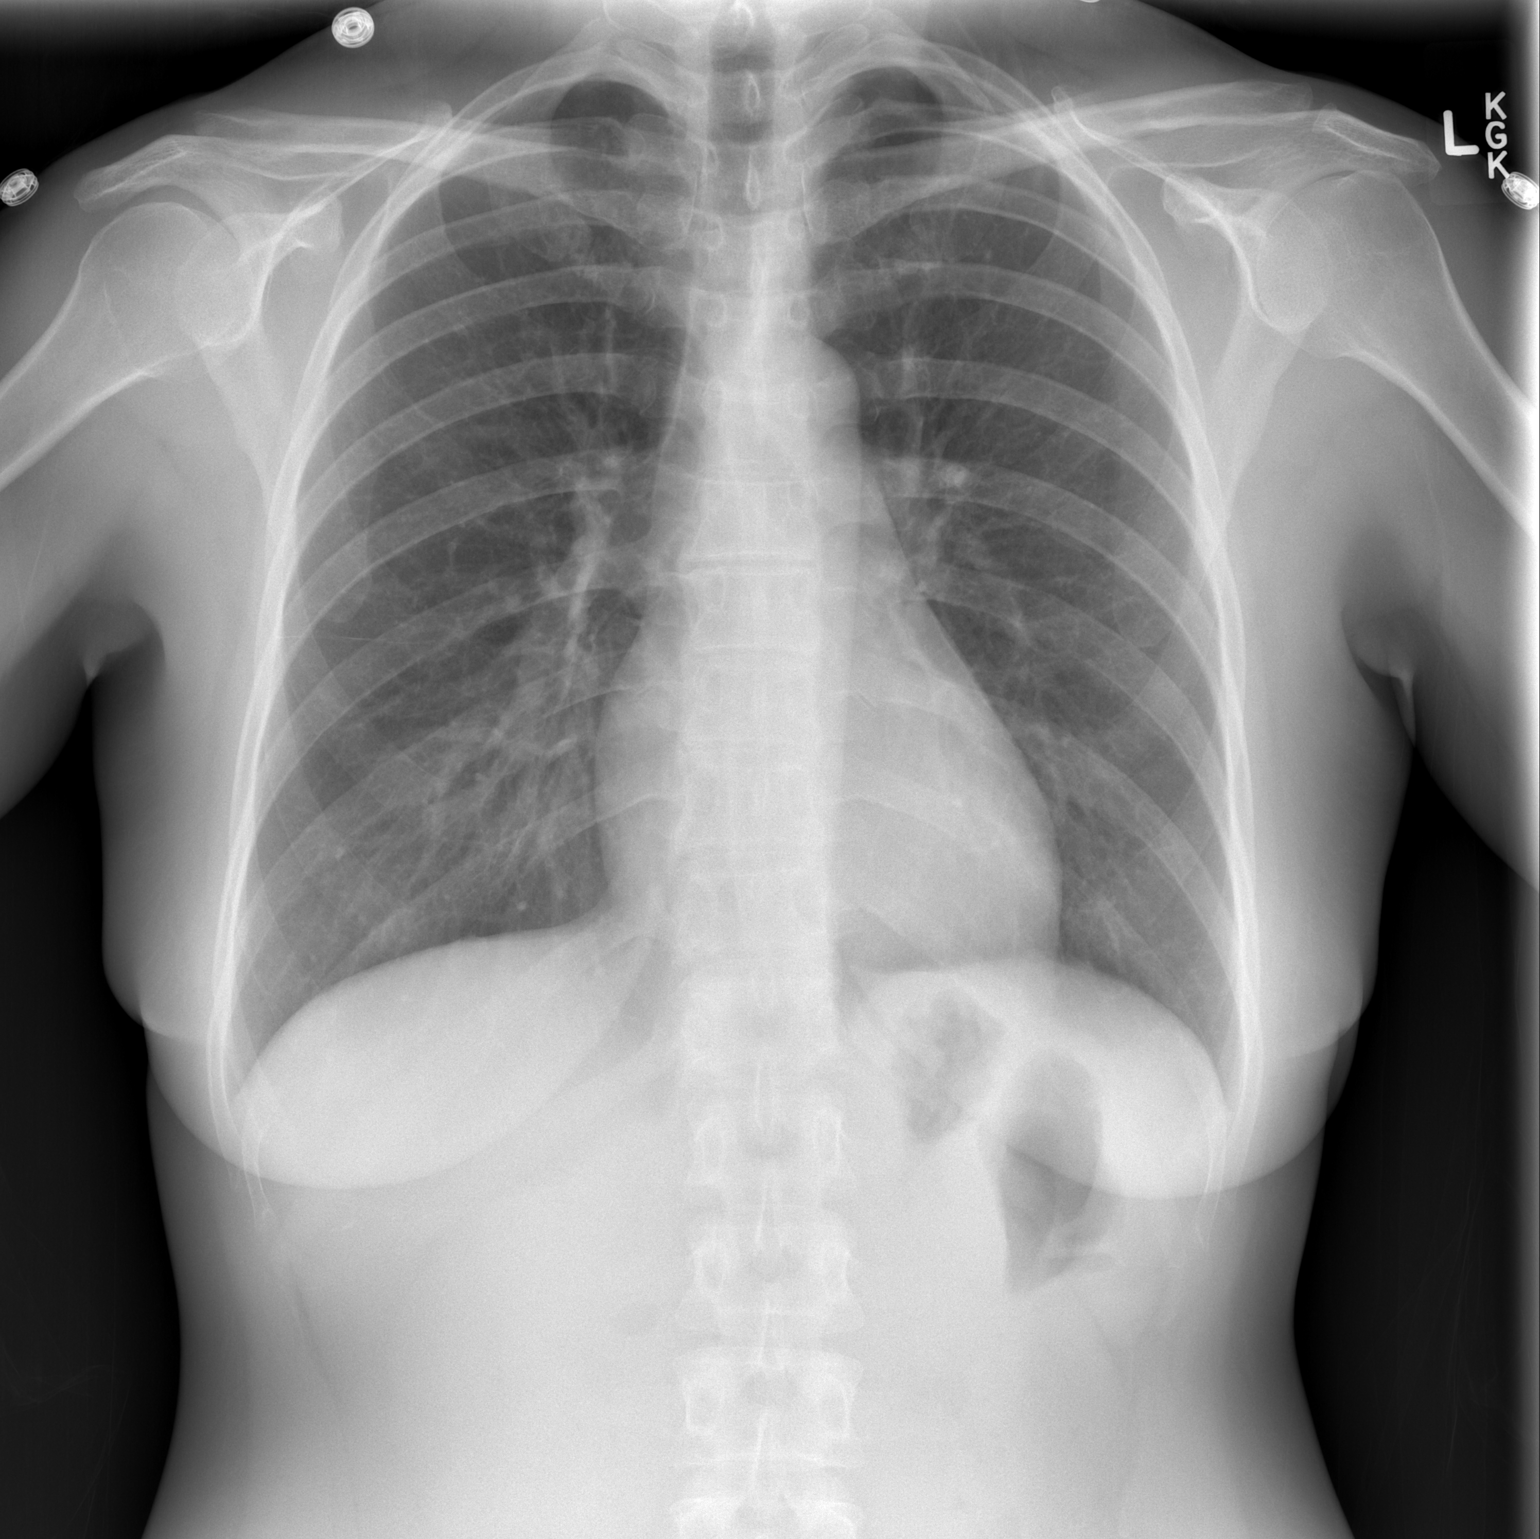

[w chest lat]
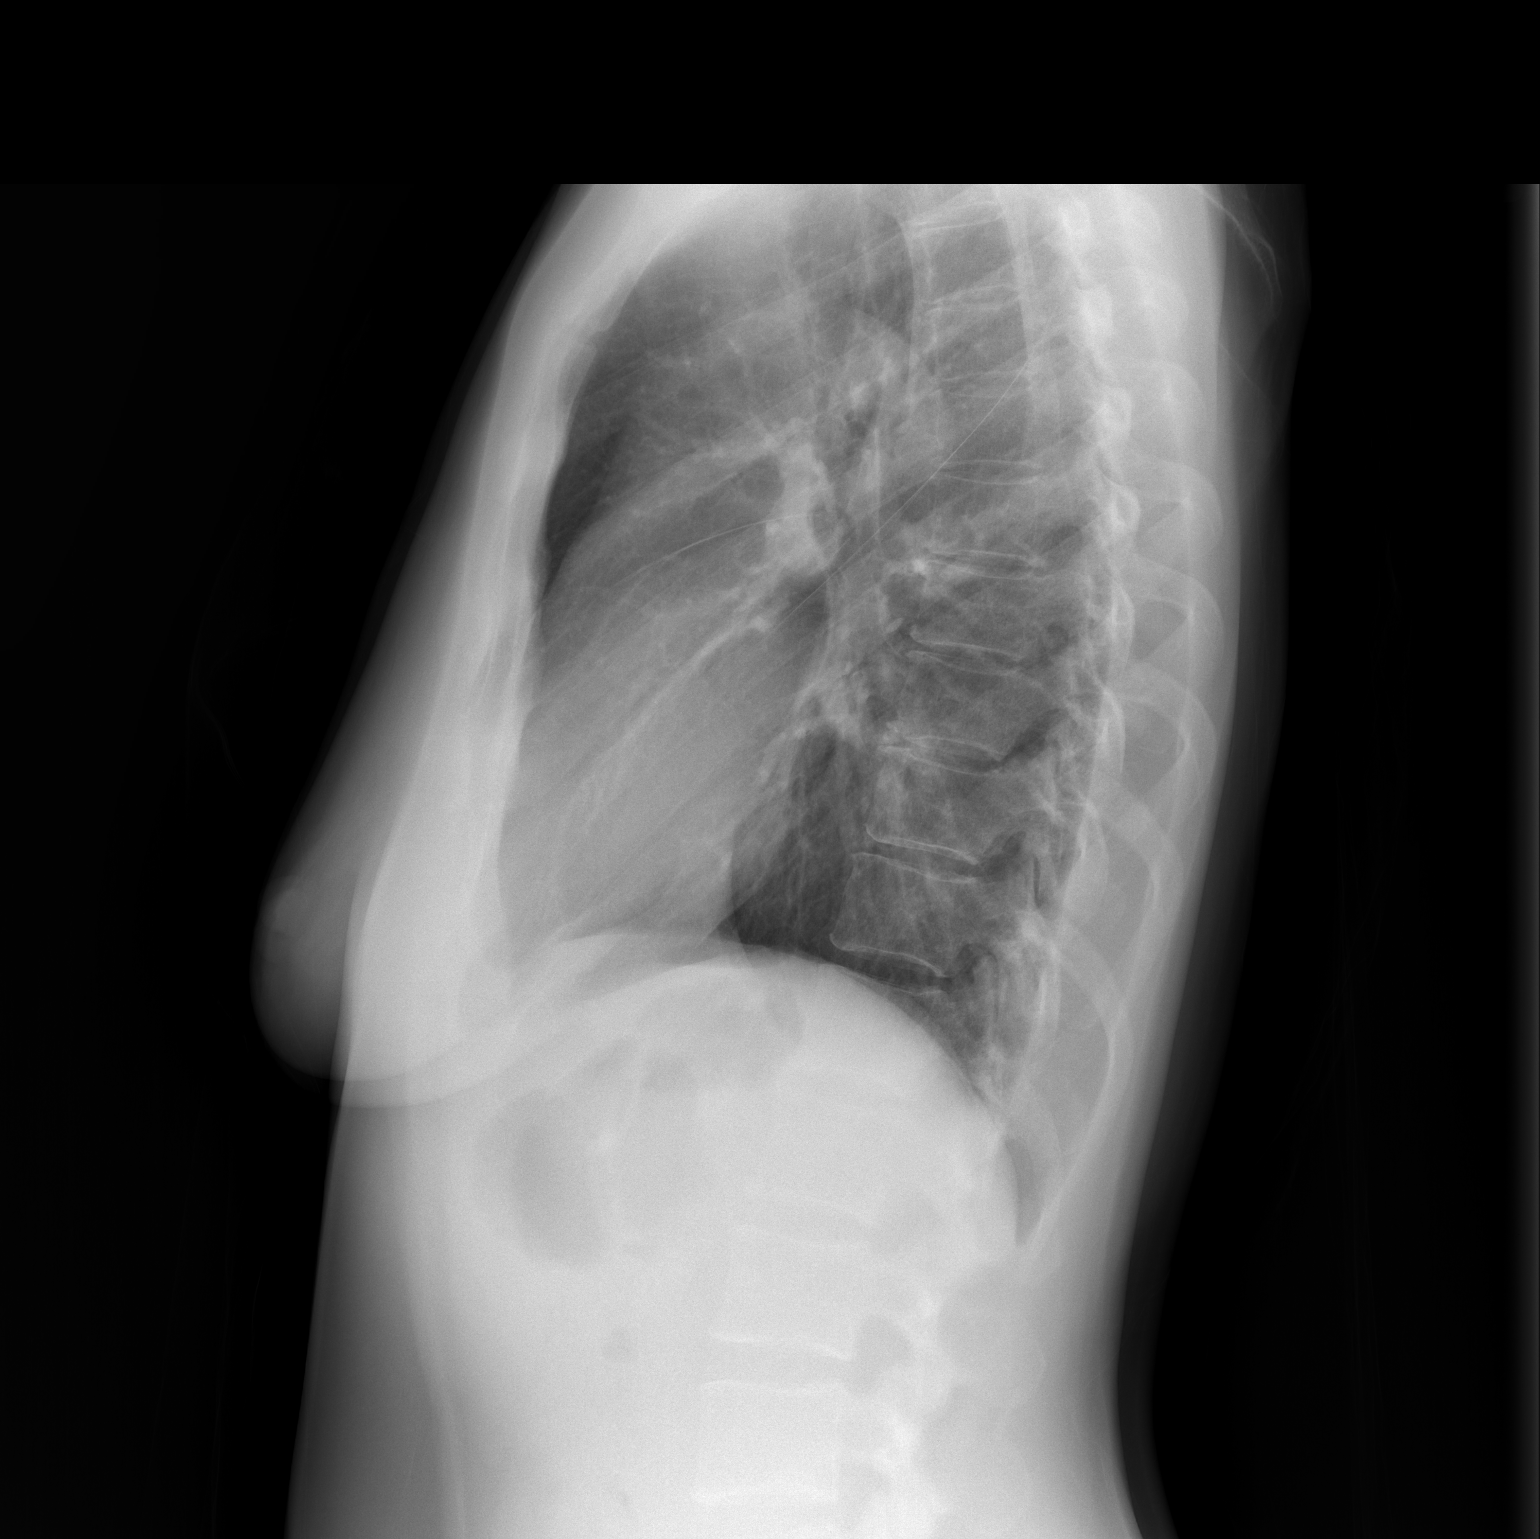

[2 of 2 positions shown; findings below may reference images not displayed]

FINDINGS: Lungs are clear. No pleural effusion or pneumothorax.

Cardiomediastinal silhouette is within normal limits.

Mild degenerative changes of the visualized thoracolumbar spine.
IMPRESSION: No evidence of acute cardiopulmonary disease.

## 2011-03-10 SURGERY — LAPAROSCOPIC CHOLECYSTECTOMY
Anesthesia: General | Site: Abdomen | Wound class: Clean Contaminated

## 2011-03-10 MED ORDER — FENTANYL CITRATE 0.05 MG/ML IJ SOLN
INTRAMUSCULAR | Status: DC | PRN
  Administered 2011-03-10: 50 ug via INTRAVENOUS
  Administered 2011-03-10: 100 ug via INTRAVENOUS

## 2011-03-10 MED ORDER — PROPOFOL 10 MG/ML IV BOLUS
INTRAVENOUS | Status: DC | PRN
  Administered 2011-03-10: 150 mg via INTRAVENOUS

## 2011-03-10 MED ORDER — SUCCINYLCHOLINE CHLORIDE 20 MG/ML IJ SOLN
INTRAMUSCULAR | Status: DC | PRN
  Administered 2011-03-10: 100 mg via INTRAVENOUS

## 2011-03-10 MED ORDER — CIPROFLOXACIN IN D5W 400 MG/200ML IV SOLN
INTRAVENOUS | Status: AC
  Filled 2011-03-10: qty 200

## 2011-03-10 MED ORDER — MUPIROCIN 2 % EX OINT
TOPICAL_OINTMENT | CUTANEOUS | Status: AC
  Filled 2011-03-10: qty 22

## 2011-03-10 MED ORDER — GLYCOPYRROLATE 0.2 MG/ML IJ SOLN
INTRAMUSCULAR | Status: DC | PRN
  Administered 2011-03-10: .4 mg via INTRAVENOUS

## 2011-03-10 MED ORDER — DEXAMETHASONE SODIUM PHOSPHATE 10 MG/ML IJ SOLN
INTRAMUSCULAR | Status: DC | PRN
  Administered 2011-03-10: 10 mg via INTRAVENOUS

## 2011-03-10 MED ORDER — ROCURONIUM BROMIDE 100 MG/10ML IV SOLN
INTRAVENOUS | Status: DC | PRN
  Administered 2011-03-10: 30 mg via INTRAVENOUS

## 2011-03-10 MED ORDER — LACTATED RINGERS IV SOLN
INTRAVENOUS | Status: DC | PRN
  Administered 2011-03-10 (×2): via INTRAVENOUS

## 2011-03-10 MED ORDER — 0.9 % SODIUM CHLORIDE (POUR BTL) OPTIME
TOPICAL | Status: DC | PRN
  Administered 2011-03-10: 1000 mL

## 2011-03-10 MED ORDER — GLUCAGON HCL (RDNA) 1 MG IJ SOLR
INTRAMUSCULAR | Status: AC
  Filled 2011-03-10: qty 1

## 2011-03-10 MED ORDER — BUPIVACAINE-EPINEPHRINE 0.25% -1:200000 IJ SOLN
INTRAMUSCULAR | Status: AC
  Filled 2011-03-10: qty 1

## 2011-03-10 MED ORDER — IOHEXOL 300 MG/ML  SOLN
INTRAMUSCULAR | Status: AC
  Filled 2011-03-10: qty 1

## 2011-03-10 MED ORDER — LACTATED RINGERS IR SOLN
Status: DC | PRN
  Administered 2011-03-10: 1

## 2011-03-10 MED ORDER — BUPIVACAINE-EPINEPHRINE 0.25% -1:200000 IJ SOLN
INTRAMUSCULAR | Status: DC | PRN
  Administered 2011-03-10: 20 mL

## 2011-03-10 MED ORDER — LIDOCAINE HCL (CARDIAC) 20 MG/ML IV SOLN
INTRAVENOUS | Status: DC | PRN
  Administered 2011-03-10: 50 mg via INTRAVENOUS

## 2011-03-10 MED ORDER — CIPROFLOXACIN IN D5W 400 MG/200ML IV SOLN
400.0000 mg | INTRAVENOUS | Status: AC
  Administered 2011-03-10: 400 mg via INTRAVENOUS

## 2011-03-10 MED ORDER — ONDANSETRON HCL 4 MG/2ML IJ SOLN
INTRAMUSCULAR | Status: DC | PRN
  Administered 2011-03-10: 4 mg via INTRAVENOUS

## 2011-03-10 MED ORDER — NEOSTIGMINE METHYLSULFATE 1 MG/ML IJ SOLN
INTRAMUSCULAR | Status: DC | PRN
  Administered 2011-03-10: 3 mg via INTRAVENOUS

## 2011-03-10 MED ORDER — LACTATED RINGERS IV SOLN
INTRAVENOUS | Status: DC
  Administered 2011-03-10: 18:00:00 via INTRAVENOUS

## 2011-03-10 MED ORDER — HYDROMORPHONE HCL PF 1 MG/ML IJ SOLN
0.2500 mg | INTRAMUSCULAR | Status: DC | PRN
  Administered 2011-03-10 (×2): 0.25 mg via INTRAVENOUS

## 2011-03-10 MED ORDER — HYDROMORPHONE HCL PF 1 MG/ML IJ SOLN
INTRAMUSCULAR | Status: AC
  Filled 2011-03-10: qty 1

## 2011-03-10 MED ORDER — IOHEXOL 300 MG/ML  SOLN
INTRAMUSCULAR | Status: DC | PRN
  Administered 2011-03-10: 1 mL

## 2011-03-10 MED ORDER — MIDAZOLAM HCL 5 MG/5ML IJ SOLN
INTRAMUSCULAR | Status: DC | PRN
  Administered 2011-03-10: 2 mg via INTRAVENOUS

## 2011-03-10 MED ORDER — PROMETHAZINE HCL 25 MG/ML IJ SOLN: 6.2500 mg | INTRAMUSCULAR | Status: DC | PRN

## 2011-03-10 SURGICAL SUPPLY — 42 items
APPLIER CLIP ROT 10 11.4 M/L (STAPLE) ×3
BENZOIN TINCTURE PRP APPL 2/3 (GAUZE/BANDAGES/DRESSINGS) IMPLANT
CABLE HIGH FREQUENCY MONO STRZ (ELECTRODE) ×3 IMPLANT
CANISTER SUCTION 2500CC (MISCELLANEOUS) ×3 IMPLANT
CATH REDDICK CHOLANGI 4FR 50CM (CATHETERS) IMPLANT
CLIP APPLIE ROT 10 11.4 M/L (STAPLE) ×2 IMPLANT
CLOTH BEACON ORANGE TIMEOUT ST (SAFETY) ×3 IMPLANT
COVER MAYO STAND STRL (DRAPES) ×3 IMPLANT
DECANTER SPIKE VIAL GLASS SM (MISCELLANEOUS) ×3 IMPLANT
DERMABOND ADVANCED (GAUZE/BANDAGES/DRESSINGS) ×1
DERMABOND ADVANCED .7 DNX12 (GAUZE/BANDAGES/DRESSINGS) ×2 IMPLANT
DRAPE C-ARM 42X72 X-RAY (DRAPES) ×3 IMPLANT
DRAPE LAPAROSCOPIC ABDOMINAL (DRAPES) ×3 IMPLANT
ELECT REM PT RETURN 9FT ADLT (ELECTROSURGICAL) ×3
ELECTRODE REM PT RTRN 9FT ADLT (ELECTROSURGICAL) ×2 IMPLANT
GLOVE BIOGEL PI IND STRL 7.0 (GLOVE) ×2 IMPLANT
GLOVE BIOGEL PI INDICATOR 7.0 (GLOVE) ×1
GLOVE SS BIOGEL STRL SZ 7.5 (GLOVE) ×2 IMPLANT
GLOVE SUPERSENSE BIOGEL SZ 7.5 (GLOVE) ×1
GLOVE SURG SS PI 6.5 STRL IVOR (GLOVE) ×6 IMPLANT
GOWN STRL NON-REIN LRG LVL3 (GOWN DISPOSABLE) ×3 IMPLANT
GOWN STRL REIN XL XLG (GOWN DISPOSABLE) ×6 IMPLANT
HEMOSTAT SURGICEL 4X8 (HEMOSTASIS) IMPLANT
IV CATH 14GX2 1/4 (CATHETERS) IMPLANT
IV LACTATED RINGERS 1000ML (IV SOLUTION) ×3 IMPLANT
IV SET EXT 30 76VOL 4 MALE LL (IV SETS) IMPLANT
KIT BASIN OR (CUSTOM PROCEDURE TRAY) ×3 IMPLANT
NS IRRIG 1000ML POUR BTL (IV SOLUTION) ×3 IMPLANT
POUCH SPECIMEN RETRIEVAL 10MM (ENDOMECHANICALS) ×3 IMPLANT
SET CHOLANGIOGRAPH MIX (MISCELLANEOUS) ×3 IMPLANT
SET IRRIG TUBING LAPAROSCOPIC (IRRIGATION / IRRIGATOR) ×3 IMPLANT
SLEEVE Z-THREAD 5X100MM (TROCAR) ×3 IMPLANT
SOLUTION ANTI FOG 6CC (MISCELLANEOUS) ×3 IMPLANT
STOPCOCK K 69 2C6206 (IV SETS) IMPLANT
STRIP CLOSURE SKIN 1/2X4 (GAUZE/BANDAGES/DRESSINGS) IMPLANT
SUT MNCRL AB 4-0 PS2 18 (SUTURE) ×3 IMPLANT
TOWEL OR 17X26 10 PK STRL BLUE (TOWEL DISPOSABLE) ×3 IMPLANT
TRAY LAP CHOLE (CUSTOM PROCEDURE TRAY) ×3 IMPLANT
TROCAR HASSON GELL 12X100 (TROCAR) ×3 IMPLANT
TROCAR Z-THREAD FIOS 11X100 BL (TROCAR) ×3 IMPLANT
TROCAR Z-THREAD FIOS 5X100MM (TROCAR) ×3 IMPLANT
TUBING INSUFFLATION 10FT LAP (TUBING) ×3 IMPLANT

## 2011-03-10 NOTE — H&P (View-Only) (Signed)
Reason for Consult: Abdominal Pain Referring Physician: Meagan Hunt  Jordan Gates is an 45 y.o. female.  HPI: the patient is a 45-year-old female who presents to the emergency room with acute abdominal pain. The patient has an intentional weight loss of 70 pounds over the end of 2012. At that time she began to experience intermittent epigastric abdominal pain. Initially this was not very frequent or severe. Over the past 4-6 weeks she has been experiencing increasingly frequent and severe episodes of abdominal pain. She has been evaluated by Dr. Jothi Mann.  She was seen 2 days ago after a self limited attack of pain. At that point an abdominal ultrasound and HIDA scan were obtained. As below her ultrasound showed significant thickening of the gallbladder wall and sludge or probable small stones. HIDA scan yesterday showed nonvisualization of the gallbladder. Last night she again had a fairly sudden onset of severe epigastric pain radiating through to her back associated with nausea. She then presented to the emergency room for evaluation. Lab work has shown mildly elevated LFTs as noted below. She received 1 dose of IV narcotics in the emergency room 6 hours ago and now her pain is completely relieved. I was asked to see the patient in consultation. She did have an appointment to be seen in our office today by Dr. Rosenbower.  She has not had any fever or chills. No dark urine or yellow color to her eyes or skin. She feels essentially well between episodes of pain. Bowel movements have been normal without melena or hematochezia.  Past Medical History  Diagnosis Date  . N&V (nausea and vomiting)     n/v  epigastric pain  . Hypertension     Past Surgical History  Procedure Date  . Tubal ligation     Family History  Problem Relation Age of Onset  . Hypertension Mother   . Cancer Mother     Social History:  reports that she has never smoked. She does not have any smokeless tobacco history on  file. She reports that she does not drink alcohol or use illicit drugs.  Allergies:  Allergies  Allergen Reactions  . Penicillins   . Sulfa Antibiotics     Current Facility-Administered Medications  Medication Dose Route Frequency Provider Last Rate Last Dose  . morphine 4 MG/ML injection 4 mg  4 mg Intravenous Once Meagan Hunt, MD   4 mg at 03/09/11 0019  . oxyCODONE-acetaminophen (PERCOCET) 5-325 MG per tablet 2 tablet  2 tablet Oral Once Meagan Hunt, MD      . sodium chloride 0.9 % bolus 1,000 mL  1,000 mL Intravenous Once Meagan Hunt, MD   1,000 mL at 03/09/11 0000   Current Outpatient Prescriptions  Medication Sig Dispense Refill  . dexlansoprazole (DEXILANT) 60 MG capsule Take 60 mg by mouth daily.      . Multiple Vitamin (MULTIVITAMIN) tablet Take 1 tablet by mouth daily.      . valsartan (DIOVAN) 160 MG tablet Take 160 mg by mouth daily.      . ondansetron (ZOFRAN ODT) 8 MG disintegrating tablet Take 1 tablet (8 mg total) by mouth every 8 (eight) hours as needed for nausea.  20 tablet  0  . oxyCODONE-acetaminophen (PERCOCET) 5-325 MG per tablet Take 2 tablets by mouth every 6 (six) hours as needed for pain.  30 tablet  0   Facility-Administered Medications Ordered in Other Encounters  Medication Dose Route Frequency Provider Last Rate Last Dose  . morphine 4   MG/ML injection 2.7 mg  2.7 mg Intravenous Once William B Veazey, MD   2.7 mg at 03/08/11 1245  . technetium TC 99M mebrofenin (CHOLETEC) injection 6 milli Curie  6 milli Curie Intravenous Once PRN Medication Radiologist, MD   5.8 milli Curie at 03/08/11 1000     Results for orders placed during the hospital encounter of 03/08/11 (from the past 48 hour(s))  CBC     Status: Abnormal   Collection Time   03/08/11 10:50 PM      Component Value Range Comment   WBC 10.1  4.0 - 10.5 (K/uL)    RBC 4.13  3.87 - 5.11 (MIL/uL)    Hemoglobin 12.3  12.0 - 15.0 (g/dL)    HCT 35.8 (*) 36.0 - 46.0 (%)    MCV 86.7  78.0 - 100.0 (fL)      MCH 29.8  26.0 - 34.0 (pg)    MCHC 34.4  30.0 - 36.0 (g/dL)    RDW 12.3  11.5 - 15.5 (%)    Platelets 192  150 - 400 (K/uL)   DIFFERENTIAL     Status: Abnormal   Collection Time   03/08/11 10:50 PM      Component Value Range Comment   Neutrophils Relative 77  43 - 77 (%)    Neutro Abs 7.8 (*) 1.7 - 7.7 (K/uL)    Lymphocytes Relative 15  12 - 46 (%)    Lymphs Abs 1.5  0.7 - 4.0 (K/uL)    Monocytes Relative 8  3 - 12 (%)    Monocytes Absolute 0.8  0.1 - 1.0 (K/uL)    Eosinophils Relative 1  0 - 5 (%)    Eosinophils Absolute 0.1  0.0 - 0.7 (K/uL)    Basophils Relative 0  0 - 1 (%)    Basophils Absolute 0.0  0.0 - 0.1 (K/uL)   COMPREHENSIVE METABOLIC PANEL     Status: Abnormal   Collection Time   03/08/11 10:50 PM      Component Value Range Comment   Sodium 136  135 - 145 (mEq/L)    Potassium 3.5  3.5 - 5.1 (mEq/L)    Chloride 99  96 - 112 (mEq/L)    CO2 25  19 - 32 (mEq/L)    Glucose, Bld 124 (*) 70 - 99 (mg/dL)    BUN 9  6 - 23 (mg/dL)    Creatinine, Ser 0.75  0.50 - 1.10 (mg/dL)    Calcium 9.4  8.4 - 10.5 (mg/dL)    Total Protein 7.5  6.0 - 8.3 (g/dL)    Albumin 4.1  3.5 - 5.2 (g/dL)    AST 221 (*) 0 - 37 (U/L)    ALT 112 (*) 0 - 35 (U/L)    Alkaline Phosphatase 123 (*) 39 - 117 (U/L)    Total Bilirubin 0.8  0.3 - 1.2 (mg/dL)    GFR calc non Af Amer >90  >90 (mL/min)    GFR calc Af Amer >90  >90 (mL/min)   PROTIME-INR     Status: Normal   Collection Time   03/08/11 10:50 PM      Component Value Range Comment   Prothrombin Time 13.9  11.6 - 15.2 (seconds)    INR 1.05  0.00 - 1.49    LIPASE, BLOOD     Status: Normal   Collection Time   03/08/11 10:59 PM      Component Value Range Comment   Lipase 48  11 - 59 (  U/L)   LACTIC ACID, PLASMA     Status: Normal   Collection Time   03/08/11 11:30 PM      Component Value Range Comment   Lactic Acid, Venous 0.9  0.5 - 2.2 (mmol/L)   URINALYSIS, ROUTINE W REFLEX MICROSCOPIC     Status: Abnormal   Collection Time   03/08/11 11:51 PM       Component Value Range Comment   Color, Urine YELLOW  YELLOW     APPearance CLEAR  CLEAR     Specific Gravity, Urine 1.016  1.005 - 1.030     pH 6.0  5.0 - 8.0     Glucose, UA NEGATIVE  NEGATIVE (mg/dL)    Hgb urine dipstick NEGATIVE  NEGATIVE     Bilirubin Urine NEGATIVE  NEGATIVE     Ketones, ur 15 (*) NEGATIVE (mg/dL)    Protein, ur NEGATIVE  NEGATIVE (mg/dL)    Urobilinogen, UA 1.0  0.0 - 1.0 (mg/dL)    Nitrite NEGATIVE  NEGATIVE     Leukocytes, UA NEGATIVE  NEGATIVE  MICROSCOPIC NOT DONE ON URINES WITH NEGATIVE PROTEIN, BLOOD, LEUKOCYTES, NITRITE, OR GLUCOSE <1000 mg/dL.    Nm Hepatobiliary Liver Func  03/08/2011  *RADIOLOGY REPORT*  Clinical Data: Epigastric pain. Nausea and vomiting.  NUCLEAR MEDICINE HEPATOHBILIARY INCLUDE GB  Radiopharmaceutical: 520 mCi technetium 99m Choletec.  2.7 mg morphine IV  Comparison: Ultrasound dated 03/08/2011  Findings: The patient was imaged for 90 minutes and activity was seen in the bile ducts and duodenum at 15 minutes.  The gallbladder was not visualized at 90 minutes.  The patient was then given 2.7 mg of morphine IV.  The gallbladder was not visualized at an additional 30 minutes.  IMPRESSION: Abnormal hepatobiliary scan with no activity in the gallbladder over a 2 hour time interval even after IV morphine. The findings are consistent with cholecystitis, most likely acute.  Chronic cholecystitis can sometimes also give this appearance.  Original Report Authenticated By: JAMES H. MAXWELL, M.D.   Us Abdomen Complete  03/08/2011  *RADIOLOGY REPORT*  Clinical Data:  History of epigastric abdominal pain and weight loss and hypertension.  ABDOMINAL ULTRASOUND COMPLETE  Comparison:  None  Findings:  Gallbladder: There is echogenic sludge within the gallbladder. Small hyperechoic echogenic areas are seen within the sludge and there is suspicion that these may reflect very tiny calculi within the sludge but no discrete acoustic shadowing posterior to the  small areas of hyperechogenicity was demonstrated to be certain that calculi are present. There is gallbladder wall thickening. There is no evidence of pericholecystic fluid. The gallbladder wall thickness measured 6.5 mm. No positive sonographic Murphy's sign according to the ultrasound technologist.  CBD: Normal in caliber measuring 5.1 mm. No choledocholithiasis is evident.  Liver:  Normal size and echotexture without focal parenchymal abnormality.  IVC:  Patent throughout its visualized course in the abdomen.  Pancreas:  Although the pancreas is difficult to visualize in its entirety, no focal pancreatic abnormality is identified.  Spleen:  Normal size and echotexture without focal abnormality. Length is 9 cm.  Right kidney:  No hydronephrosis.  Well-preserved cortex.  Normal parenchymal echotexture without focal abnormalities.  Right renal length is 11.1 cm.  Left kidney:  No hydronephrosis.  Well-preserved cortex.  Normal parenchymal echotexture without focal abnormalities.  Left renal length is 11.1 cm.  Aorta:  Maximum diameter is 2.1 cm.  No aneurysm is evident.  Ascites:  None.  IMPRESSION:   There is thickening of the   gallbladder wall.  There was no positive sonographic Murphy's sign according to the ultrasound technologist. No pericholecystic fluid is evident.  Echogenic sludge is present within the gallbladder. Small hyperechoic echogenic areas are seen within the sludge and there is suspicion that these may reflect very tiny calculi within the sludge but no discrete acoustic shadowing posterior to the small areas of hyperechogenicity was demonstrated to be certain that calculi are present.  Original Report Authenticated By: DAVID CALL, M.D.    Review of Systems  Constitutional: Positive for weight loss. Negative for fever and chills.  HENT: Negative.   Respiratory: Negative for cough, sputum production and shortness of breath.   Cardiovascular: Negative for chest pain and palpitations.   Gastrointestinal: Positive for nausea, vomiting and abdominal pain. Negative for heartburn, diarrhea, constipation and blood in stool.  Genitourinary: Negative for dysuria, urgency and frequency.  Musculoskeletal: Negative for myalgias and joint pain.  Endo/Heme/Allergies: Does not bruise/bleed easily.   Blood pressure 107/66, pulse 50, temperature 97.9 F (36.6 C), temperature source Oral, resp. rate 20, last menstrual period 02/14/2011, SpO2 96.00%. Physical Exam General: Alert, well-developed Caucasian female, in no distress Skin: Warm and dry without rash or infection. HEENT: No palpable masses or thyromegaly. Sclera nonicteric. Pupils equal round and reactive. Oropharynx clear. Lymph nodes: No cervical, supraclavicular, or inguinal nodes palpable. Lungs: Breath sounds clear and equal without increased work of breathing Cardiovascular: Regular rate and rhythm without murmur. No JVD or edema. Peripheral pulses intact. Abdomen: Nondistended. Soft with minimal epigastric tenderness to deep palpation and no guarding.. No masses palpable. No organomegaly. No palpable hernias. Extremities: No edema or joint swelling or deformity. No chronic venous stasis changes. Neurologic: Alert and fully oriented..  Assessment/Plan: Repeated and worsening episodes of epigastric abdominal pain. Gallbladder ultrasound shows significant wall thickening and probable small stones. HIDA was positive for nonvisualization of the gallbladder. She has mildly elevated LFTs consistent with inflammatory change versus less likely common bile duct stones. Currently the patient is completely asymptomatic. I suspect that her HIDA scan result is due to significant chronic cholecystitis rather than acute cholecystitis as she does not have any significant pain or tenderness currently and no fever or elevated white blood count. We discussed options of immediate admission versus urgent elective cholecystectomy. As she is currently  feeling well we will plan to let her go home and we will arrange outpatient laparoscopic cholecystectomy for later this week. We discussed the nature of the procedure and its indications as well as risks of bleeding, infection, bile leak, bile duct injury and possible need for open procedure.  Deshane Cotroneo T 03/09/2011, 4:41 AM      

## 2011-03-10 NOTE — Op Note (Signed)
Preoperative diagnosis: Cholelithiasis and cholecystitis  Postoperative diagnosis: Cholelithiasis and cholecystitis  Surgical procedure: Laparoscopic cholecystectomy with intraoperative cholangiogram  Surgeon: Sharlet Salina T. Lyndee Herbst M.D.  Assistant: None  Anesthesia: General Endotracheal  Complications: None  Estimated blood loss: Minimal  Description of procedure: The patient brought to the operating room, placed in the supine position on the operating table, and general endotracheal anesthesia induced. The abdomen was widely sterilely prepped and draped. The patient had received preoperative IV antibiotics and PAS were in place. Patient timeout was performed the correct procedure verified. Standard 4 port technique was used with an open Hassan cannula at the umbilicus and the remainder of the ports placed under direct vision. The gallbladder was visualized. It appeared markedly edematous but was not tense or severely inflamed.. The fundus was grasped and elevated up over the liver and the infundibulum retracted inferiolaterally. Peritoneum anterior and posterior to close triangle was incised and fibrofatty tissue stripped off the neck of the gallbladder toward the porta hepatis. The distal gallbladder was thoroughly dissected. The cystic artery was identified in close triangle and the cystic duct gallbladder junction dissected 360.  A good critical view was obtained. When the anatomy was clear the cystic duct was clipped at the gallbladder junction and an operative cholangiogram was attempted via the cystic duct. The Cholangiocath wouldn't pass for about a centimeter without difficulty but the saline would not flow and refluxed. I milked the cystic duct back toward the gallbladder and there was no evidence of stone in the cystic duct. I dissected the cystic duct out a little bit further toward the common bile duct with the anatomy being completely clear. I made another opening in the cystic duct a  little more toward the common bile duct and again the catheter advanced easily for about a centimeter but would not go further and the saline reflux. I could pass the end of a right angle beyond this point and milking the cystic duct back again there appeared to be no stone and I suspect that this was a valve. I felt any more attempts at a cholangiogram would not be fruitful and at this point the cystic duct was triply clipped proximally and divided. The cystic artery was doubly clipped proximally and distally and divided. The gallbladder was dissected free from its bed using hook cautery and removed through the umbilical port site. Complete hemostasis was obtained in the gallbladder bed. The right upper quadrant was thoroughly irrigated and hemostasis assured. Trochars were removed and all CO2 evacuated and the [facility] trocar site fascial defect closed. Skin incisions were closed with subcuticular Monocryl and Dermabond. Sponge needle and instrument counts were correct. The patient was taken to PACU in good condition.  Arcenio Mullaly T  03/10/2011

## 2011-03-10 NOTE — Anesthesia Preprocedure Evaluation (Addendum)
Anesthesia Evaluation  Patient identified by MRN, date of birth, ID band Patient awake    Reviewed: Allergy & Precautions, H&P , NPO status , Patient's Chart, lab work & pertinent test results, reviewed documented beta blocker date and time   Airway Mallampati: II TM Distance: >3 FB Neck ROM: full    Dental No notable dental hx. (+) Teeth Intact and Dental Advisory Given   Pulmonary neg pulmonary ROS,  clear to auscultation  Pulmonary exam normal       Cardiovascular Exercise Tolerance: Good hypertension, On Medications neg cardio ROS regular Normal    Neuro/Psych Negative Neurological ROS  Negative Psych ROS   GI/Hepatic negative GI ROS, Neg liver ROS,   Endo/Other  Negative Endocrine ROS  Renal/GU negative Renal ROS  Genitourinary negative   Musculoskeletal   Abdominal   Peds  Hematology negative hematology ROS (+)   Anesthesia Other Findings   Reproductive/Obstetrics negative OB ROS                          Anesthesia Physical Anesthesia Plan  ASA: II  Anesthesia Plan: General   Post-op Pain Management:    Induction: Intravenous  Airway Management Planned: Oral ETT  Additional Equipment:   Intra-op Plan:   Post-operative Plan: Extubation in OR  Informed Consent: I have reviewed the patients History and Physical, chart, labs and discussed the procedure including the risks, benefits and alternatives for the proposed anesthesia with the patient or authorized representative who has indicated his/her understanding and acceptance.   Dental Advisory Given  Plan Discussed with: CRNA and Surgeon  Anesthesia Plan Comments:         Anesthesia Quick Evaluation

## 2011-03-10 NOTE — Anesthesia Procedure Notes (Signed)
Procedure Name: Intubation Date/Time: 03/10/2011 3:51 PM Performed by: Joycie Peek Pre-anesthesia Checklist: Patient identified, Timeout performed, Emergency Drugs available, Suction available and Patient being monitored Patient Re-evaluated:Patient Re-evaluated prior to inductionOxygen Delivery Method: Circle System Utilized Preoxygenation: Pre-oxygenation with 100% oxygen Intubation Type: IV induction Laryngoscope Size: Mac and 4 Grade View: Grade I Tube type: Oral Tube size: 7.0 mm Airway Equipment and Method: stylet Placement Confirmation: ETT inserted through vocal cords under direct vision,  breath sounds checked- equal and bilateral and positive ETCO2 Secured at: 21 cm Tube secured with: Tape Dental Injury: Teeth and Oropharynx as per pre-operative assessment

## 2011-03-10 NOTE — Interval H&P Note (Signed)
History and Physical Interval Note:  03/10/2011 4:04 PM  Jordan Gates  has presented today for surgery, with the diagnosis of gallstones  The various methods of treatment have been discussed with the patient and family. After consideration of risks, benefits and other options for treatment, the patient has consented to  Procedure(s): LAPAROSCOPIC CHOLECYSTECTOMY WITH INTRAOPERATIVE CHOLANGIOGRAM as a surgical intervention .  The patients' history has been reviewed, patient examined, no change in status, stable for surgery.  I have reviewed the patients' chart and labs.  Questions were answered to the patient's satisfaction.     Jenene Kauffmann T

## 2011-03-10 NOTE — Transfer of Care (Signed)
Immediate Anesthesia Transfer of Care Note  Patient: Jordan Gates, Jordan Gates  Procedure(s) Performed:  LAPAROSCOPIC CHOLECYSTECTOMY WITH INTRAOPERATIVE CHOLANGIOGRAM  Patient Location: PACU  Anesthesia Type: General  Level of Consciousness: sedated  Airway & Oxygen Therapy: Patient Spontanous Breathing and Patient connected to face mask oxygen  Post-op Assessment: Report given to PACU RN and Post -op Vital signs reviewed and stable  Post vital signs: Reviewed and stable  Complications: No apparent anesthesia complications

## 2011-03-10 NOTE — Anesthesia Postprocedure Evaluation (Signed)
  Anesthesia Post-op Note  Patient: Teacher, music  Procedure(s) Performed:  LAPAROSCOPIC CHOLECYSTECTOMY  Patient Location: PACU  Anesthesia Type: General  Level of Consciousness: awake and alert   Airway and Oxygen Therapy: Patient Spontanous Breathing  Post-op Pain: mild  Post-op Assessment: Post-op Vital signs reviewed, Patient's Cardiovascular Status Stable, Respiratory Function Stable, Patent Airway and No signs of Nausea or vomiting  Post-op Vital Signs: stable  Complications: No apparent anesthesia complications

## 2011-03-11 ENCOUNTER — Other Ambulatory Visit (INDEPENDENT_AMBULATORY_CARE_PROVIDER_SITE_OTHER): Payer: Self-pay | Admitting: General Surgery

## 2011-04-01 ENCOUNTER — Encounter (INDEPENDENT_AMBULATORY_CARE_PROVIDER_SITE_OTHER): Payer: Self-pay | Admitting: General Surgery

## 2011-04-01 ENCOUNTER — Ambulatory Visit (INDEPENDENT_AMBULATORY_CARE_PROVIDER_SITE_OTHER): Payer: BC Managed Care – PPO | Admitting: General Surgery

## 2011-04-01 VITALS — BP 122/78 | HR 52 | Temp 99.0°F | Ht 66.0 in | Wt 145.4 lb

## 2011-04-01 DIAGNOSIS — Z09 Encounter for follow-up examination after completed treatment for conditions other than malignant neoplasm: Secondary | ICD-10-CM

## 2011-04-01 NOTE — Progress Notes (Signed)
History: Patient returns now approximately 3 weeks following urgent laparoscopic cholecystectomy which presented with acute symptoms. At this point she is feeling much better. She has had no further pain. She sometimes notes she fills up quickly.  Exam: She appears well. Abdomen is soft and nontender and was feeling well.  Pathology revealed chronic cholecystitis and cholelithiasis.  Assessment and plan: Doing well following laparoscopic cholecystectomy. She had moderately elevated LFTs preoperatively and I was unable to get a cholangiogram at the time of surgery. We will recheck her LFTs and call her. If these are normalizing she will return as needed.

## 2011-04-02 ENCOUNTER — Encounter (HOSPITAL_COMMUNITY): Payer: Self-pay | Admitting: General Surgery

## 2011-04-02 LAB — HEPATIC FUNCTION PANEL
ALT: 13 U/L (ref 0–35)
AST: 18 U/L (ref 0–37)
Albumin: 4.8 g/dL (ref 3.5–5.2)
Alkaline Phosphatase: 77 U/L (ref 39–117)
Bilirubin, Direct: 0.2 mg/dL (ref 0.0–0.3)
Indirect Bilirubin: 0.4 mg/dL (ref 0.0–0.9)
Total Bilirubin: 0.6 mg/dL (ref 0.3–1.2)
Total Protein: 7.7 g/dL (ref 6.0–8.3)

## 2011-04-05 ENCOUNTER — Telehealth (INDEPENDENT_AMBULATORY_CARE_PROVIDER_SITE_OTHER): Payer: Self-pay

## 2011-04-05 NOTE — Telephone Encounter (Signed)
Patient given lab results 04/05/11/cm

## 2011-04-07 NOTE — Telephone Encounter (Signed)
Patient aware of lab results.

## 2011-07-01 ENCOUNTER — Other Ambulatory Visit: Payer: Self-pay | Admitting: Obstetrics and Gynecology

## 2012-10-05 ENCOUNTER — Other Ambulatory Visit: Payer: Self-pay | Admitting: Obstetrics and Gynecology

## 2012-10-05 DIAGNOSIS — R928 Other abnormal and inconclusive findings on diagnostic imaging of breast: Secondary | ICD-10-CM

## 2012-10-17 ENCOUNTER — Ambulatory Visit
Admission: RE | Admit: 2012-10-17 | Discharge: 2012-10-17 | Disposition: A | Discharge status: Home / Self Care | Payer: BC Managed Care – PPO | Source: Ambulatory Visit | Attending: Obstetrics and Gynecology | Admitting: Obstetrics and Gynecology

## 2012-10-17 DIAGNOSIS — R928 Other abnormal and inconclusive findings on diagnostic imaging of breast: Secondary | ICD-10-CM

## 2014-10-14 ENCOUNTER — Other Ambulatory Visit: Payer: Self-pay | Admitting: Obstetrics and Gynecology

## 2014-10-16 LAB — CYTOLOGY - PAP

## 2018-06-08 DIAGNOSIS — L508 Other urticaria: Secondary | ICD-10-CM | POA: Diagnosis not present

## 2018-06-08 DIAGNOSIS — I10 Essential (primary) hypertension: Secondary | ICD-10-CM | POA: Diagnosis not present

## 2018-06-12 DIAGNOSIS — L508 Other urticaria: Secondary | ICD-10-CM | POA: Diagnosis not present

## 2018-06-12 DIAGNOSIS — I10 Essential (primary) hypertension: Secondary | ICD-10-CM | POA: Diagnosis not present

## 2018-06-21 DIAGNOSIS — E1165 Type 2 diabetes mellitus with hyperglycemia: Secondary | ICD-10-CM | POA: Diagnosis not present

## 2018-06-21 DIAGNOSIS — I10 Essential (primary) hypertension: Secondary | ICD-10-CM | POA: Diagnosis not present

## 2018-06-21 DIAGNOSIS — E782 Mixed hyperlipidemia: Secondary | ICD-10-CM | POA: Diagnosis not present

## 2019-12-10 DIAGNOSIS — Z13220 Encounter for screening for lipoid disorders: Secondary | ICD-10-CM | POA: Diagnosis not present

## 2019-12-10 DIAGNOSIS — I10 Essential (primary) hypertension: Secondary | ICD-10-CM | POA: Diagnosis not present

## 2019-12-10 DIAGNOSIS — L508 Other urticaria: Secondary | ICD-10-CM | POA: Diagnosis not present

## 2019-12-10 DIAGNOSIS — Z0000 Encounter for general adult medical examination without abnormal findings: Secondary | ICD-10-CM | POA: Diagnosis not present

## 2019-12-10 DIAGNOSIS — Z23 Encounter for immunization: Secondary | ICD-10-CM | POA: Diagnosis not present

## 2020-01-04 DIAGNOSIS — R350 Frequency of micturition: Secondary | ICD-10-CM | POA: Diagnosis not present

## 2020-01-30 DIAGNOSIS — M65312 Trigger thumb, left thumb: Secondary | ICD-10-CM | POA: Diagnosis not present

## 2020-01-31 ENCOUNTER — Other Ambulatory Visit: Payer: Self-pay | Admitting: Orthopedic Surgery

## 2020-02-04 ENCOUNTER — Encounter (HOSPITAL_BASED_OUTPATIENT_CLINIC_OR_DEPARTMENT_OTHER): Payer: Self-pay | Admitting: Orthopedic Surgery

## 2020-02-04 ENCOUNTER — Other Ambulatory Visit: Payer: Self-pay

## 2020-02-07 ENCOUNTER — Encounter (HOSPITAL_BASED_OUTPATIENT_CLINIC_OR_DEPARTMENT_OTHER)
Admission: RE | Admit: 2020-02-07 | Discharge: 2020-02-07 | Disposition: A | Discharge status: Home / Self Care | Payer: BC Managed Care – PPO | Source: Ambulatory Visit | Attending: Orthopedic Surgery | Admitting: Orthopedic Surgery

## 2020-02-07 DIAGNOSIS — Z01812 Encounter for preprocedural laboratory examination: Secondary | ICD-10-CM | POA: Diagnosis not present

## 2020-02-07 NOTE — Progress Notes (Signed)

## 2020-02-08 ENCOUNTER — Other Ambulatory Visit (HOSPITAL_COMMUNITY)
Admission: RE | Admit: 2020-02-08 | Discharge: 2020-02-08 | Disposition: A | Discharge status: Home / Self Care | Payer: BC Managed Care – PPO | Source: Ambulatory Visit | Attending: Orthopedic Surgery | Admitting: Orthopedic Surgery

## 2020-02-08 DIAGNOSIS — Z01812 Encounter for preprocedural laboratory examination: Secondary | ICD-10-CM | POA: Diagnosis not present

## 2020-02-08 DIAGNOSIS — Z20822 Contact with and (suspected) exposure to covid-19: Secondary | ICD-10-CM | POA: Diagnosis not present

## 2020-02-08 LAB — SARS CORONAVIRUS 2 (TAT 6-24 HRS): SARS Coronavirus 2: NEGATIVE

## 2020-02-13 DIAGNOSIS — Z23 Encounter for immunization: Secondary | ICD-10-CM | POA: Diagnosis not present

## 2020-02-13 DIAGNOSIS — Z1231 Encounter for screening mammogram for malignant neoplasm of breast: Secondary | ICD-10-CM | POA: Diagnosis not present

## 2020-02-13 DIAGNOSIS — Z6835 Body mass index (BMI) 35.0-35.9, adult: Secondary | ICD-10-CM | POA: Diagnosis not present

## 2020-02-13 DIAGNOSIS — Z01419 Encounter for gynecological examination (general) (routine) without abnormal findings: Secondary | ICD-10-CM | POA: Diagnosis not present

## 2020-02-15 ENCOUNTER — Encounter (HOSPITAL_BASED_OUTPATIENT_CLINIC_OR_DEPARTMENT_OTHER): Payer: Self-pay | Admitting: Orthopedic Surgery

## 2020-02-20 ENCOUNTER — Other Ambulatory Visit (HOSPITAL_COMMUNITY)
Admission: RE | Admit: 2020-02-20 | Discharge: 2020-02-20 | Disposition: A | Discharge status: Home / Self Care | Payer: BC Managed Care – PPO | Source: Ambulatory Visit | Attending: Orthopedic Surgery | Admitting: Orthopedic Surgery

## 2020-02-20 DIAGNOSIS — Z20822 Contact with and (suspected) exposure to covid-19: Secondary | ICD-10-CM | POA: Insufficient documentation

## 2020-02-20 DIAGNOSIS — Z01812 Encounter for preprocedural laboratory examination: Secondary | ICD-10-CM | POA: Insufficient documentation

## 2020-02-20 DIAGNOSIS — M65312 Trigger thumb, left thumb: Secondary | ICD-10-CM | POA: Diagnosis not present

## 2020-02-20 DIAGNOSIS — Z882 Allergy status to sulfonamides status: Secondary | ICD-10-CM | POA: Diagnosis not present

## 2020-02-20 DIAGNOSIS — Z880 Allergy status to penicillin: Secondary | ICD-10-CM | POA: Diagnosis not present

## 2020-02-20 DIAGNOSIS — Z79899 Other long term (current) drug therapy: Secondary | ICD-10-CM | POA: Diagnosis not present

## 2020-02-20 LAB — SARS CORONAVIRUS 2 (TAT 6-24 HRS): SARS Coronavirus 2: NEGATIVE

## 2020-02-22 ENCOUNTER — Encounter (HOSPITAL_BASED_OUTPATIENT_CLINIC_OR_DEPARTMENT_OTHER): Payer: Self-pay | Admitting: Orthopedic Surgery

## 2020-02-22 ENCOUNTER — Ambulatory Visit (HOSPITAL_BASED_OUTPATIENT_CLINIC_OR_DEPARTMENT_OTHER): Payer: BC Managed Care – PPO | Admitting: Anesthesiology

## 2020-02-22 ENCOUNTER — Other Ambulatory Visit: Payer: Self-pay

## 2020-02-22 ENCOUNTER — Encounter (HOSPITAL_BASED_OUTPATIENT_CLINIC_OR_DEPARTMENT_OTHER)
Admission: RE | Disposition: A | Discharge status: Home / Self Care | Payer: Self-pay | Source: Home / Self Care | Attending: Orthopedic Surgery

## 2020-02-22 ENCOUNTER — Ambulatory Visit (HOSPITAL_BASED_OUTPATIENT_CLINIC_OR_DEPARTMENT_OTHER)
Admission: RE | Admit: 2020-02-22 | Discharge: 2020-02-22 | Disposition: A | Discharge status: Home / Self Care | Payer: BC Managed Care – PPO | Attending: Orthopedic Surgery | Admitting: Orthopedic Surgery

## 2020-02-22 DIAGNOSIS — Z20822 Contact with and (suspected) exposure to covid-19: Secondary | ICD-10-CM | POA: Diagnosis not present

## 2020-02-22 DIAGNOSIS — Z882 Allergy status to sulfonamides status: Secondary | ICD-10-CM | POA: Diagnosis not present

## 2020-02-22 DIAGNOSIS — Z880 Allergy status to penicillin: Secondary | ICD-10-CM | POA: Diagnosis not present

## 2020-02-22 DIAGNOSIS — Z79899 Other long term (current) drug therapy: Secondary | ICD-10-CM | POA: Diagnosis not present

## 2020-02-22 DIAGNOSIS — I10 Essential (primary) hypertension: Secondary | ICD-10-CM | POA: Diagnosis not present

## 2020-02-22 DIAGNOSIS — K819 Cholecystitis, unspecified: Secondary | ICD-10-CM | POA: Diagnosis not present

## 2020-02-22 DIAGNOSIS — M65312 Trigger thumb, left thumb: Secondary | ICD-10-CM | POA: Diagnosis not present

## 2020-02-22 HISTORY — DX: Essential (primary) hypertension: I10

## 2020-02-22 HISTORY — PX: TRIGGER FINGER RELEASE: SHX641

## 2020-02-22 HISTORY — DX: Gastro-esophageal reflux disease without esophagitis: K21.9

## 2020-02-22 SURGERY — RELEASE, A1 PULLEY, FOR TRIGGER FINGER
Anesthesia: Regional | Laterality: Left

## 2020-02-22 MED ORDER — ONDANSETRON HCL 4 MG/2ML IJ SOLN
INTRAMUSCULAR | Status: DC | PRN
  Administered 2020-02-22: 4 mg via INTRAVENOUS

## 2020-02-22 MED ORDER — FENTANYL CITRATE (PF) 100 MCG/2ML IJ SOLN: 25.0000 ug | INTRAMUSCULAR | Status: DC | PRN

## 2020-02-22 MED ORDER — PROPOFOL 500 MG/50ML IV EMUL
INTRAVENOUS | Status: DC | PRN
  Administered 2020-02-22: 125 ug/kg/min via INTRAVENOUS

## 2020-02-22 MED ORDER — HYDROCODONE-ACETAMINOPHEN 5-325 MG PO TABS: ORAL_TABLET | ORAL | 0 refills | Status: AC

## 2020-02-22 MED ORDER — ONDANSETRON HCL 4 MG/2ML IJ SOLN
INTRAMUSCULAR | Status: AC
  Filled 2020-02-22: qty 2

## 2020-02-22 MED ORDER — FENTANYL CITRATE (PF) 100 MCG/2ML IJ SOLN
INTRAMUSCULAR | Status: DC | PRN
  Administered 2020-02-22: 50 ug via INTRAVENOUS

## 2020-02-22 MED ORDER — PROMETHAZINE HCL 25 MG/ML IJ SOLN: 6.2500 mg | INTRAMUSCULAR | Status: DC | PRN

## 2020-02-22 MED ORDER — BUPIVACAINE HCL (PF) 0.25 % IJ SOLN
INTRAMUSCULAR | Status: DC | PRN
  Administered 2020-02-22: 5 mL

## 2020-02-22 MED ORDER — FENTANYL CITRATE (PF) 100 MCG/2ML IJ SOLN
INTRAMUSCULAR | Status: AC
  Filled 2020-02-22: qty 2

## 2020-02-22 MED ORDER — OXYCODONE HCL 5 MG/5ML PO SOLN: 5.0000 mg | Freq: Once | ORAL | Status: DC | PRN

## 2020-02-22 MED ORDER — LIDOCAINE HCL (PF) 0.5 % IJ SOLN
INTRAMUSCULAR | Status: DC | PRN
  Administered 2020-02-22: 30 mL via INTRAVENOUS

## 2020-02-22 MED ORDER — LACTATED RINGERS IV SOLN: INTRAVENOUS | Status: DC

## 2020-02-22 MED ORDER — MIDAZOLAM HCL 5 MG/5ML IJ SOLN
INTRAMUSCULAR | Status: DC | PRN
  Administered 2020-02-22: 2 mg via INTRAVENOUS

## 2020-02-22 MED ORDER — CEFAZOLIN SODIUM-DEXTROSE 2-3 GM-%(50ML) IV SOLR
INTRAVENOUS | Status: DC | PRN
  Administered 2020-02-22: 2 g via INTRAVENOUS

## 2020-02-22 MED ORDER — OXYCODONE HCL 5 MG PO TABS: 5.0000 mg | ORAL_TABLET | Freq: Once | ORAL | Status: DC | PRN

## 2020-02-22 MED ORDER — MIDAZOLAM HCL 2 MG/2ML IJ SOLN
INTRAMUSCULAR | Status: AC
  Filled 2020-02-22: qty 2

## 2020-02-22 SURGICAL SUPPLY — 31 items
BLADE SURG 15 STRL LF DISP TIS (BLADE) ×2 IMPLANT
BLADE SURG 15 STRL SS (BLADE) ×4
BNDG COHESIVE 2X5 TAN STRL LF (GAUZE/BANDAGES/DRESSINGS) ×2 IMPLANT
BNDG COHESIVE 3X5 TAN NS (GAUZE/BANDAGES/DRESSINGS) ×2 IMPLANT
BNDG ESMARK 4X9 LF (GAUZE/BANDAGES/DRESSINGS) IMPLANT
CHLORAPREP W/TINT 26 (MISCELLANEOUS) ×2 IMPLANT
CORD BIPOLAR FORCEPS 12FT (ELECTRODE) ×2 IMPLANT
COVER BACK TABLE 60X90IN (DRAPES) ×2 IMPLANT
COVER MAYO STAND STRL (DRAPES) ×2 IMPLANT
COVER WAND RF STERILE (DRAPES) IMPLANT
CUFF TOURN SGL QUICK 18X4 (TOURNIQUET CUFF) ×2 IMPLANT
DRAPE EXTREMITY T 121X128X90 (DISPOSABLE) ×2 IMPLANT
DRAPE SURG 17X23 STRL (DRAPES) ×2 IMPLANT
GAUZE SPONGE 4X4 12PLY STRL (GAUZE/BANDAGES/DRESSINGS) ×2 IMPLANT
GAUZE SPONGE 4X4 12PLY STRL LF (GAUZE/BANDAGES/DRESSINGS) ×2 IMPLANT
GAUZE XEROFORM 1X8 LF (GAUZE/BANDAGES/DRESSINGS) ×2 IMPLANT
GLOVE BIO SURGEON STRL SZ7.5 (GLOVE) ×2 IMPLANT
GLOVE SRG 8 PF TXTR STRL LF DI (GLOVE) ×1 IMPLANT
GLOVE SURG UNDER POLY LF SZ8 (GLOVE) ×2
GOWN STRL REUS W/ TWL LRG LVL3 (GOWN DISPOSABLE) ×1 IMPLANT
GOWN STRL REUS W/TWL LRG LVL3 (GOWN DISPOSABLE) ×2
GOWN STRL REUS W/TWL XL LVL3 (GOWN DISPOSABLE) ×2 IMPLANT
NEEDLE HYPO 25X1 1.5 SAFETY (NEEDLE) ×2 IMPLANT
NS IRRIG 1000ML POUR BTL (IV SOLUTION) ×2 IMPLANT
PACK BASIN DAY SURGERY FS (CUSTOM PROCEDURE TRAY) ×2 IMPLANT
STOCKINETTE 4X48 STRL (DRAPES) ×2 IMPLANT
SUT ETHILON 4 0 PS 2 18 (SUTURE) ×2 IMPLANT
SYR BULB EAR ULCER 3OZ GRN STR (SYRINGE) ×2 IMPLANT
SYR CONTROL 10ML LL (SYRINGE) ×2 IMPLANT
TOWEL GREEN STERILE FF (TOWEL DISPOSABLE) ×4 IMPLANT
UNDERPAD 30X36 HEAVY ABSORB (UNDERPADS AND DIAPERS) ×2 IMPLANT

## 2020-02-22 NOTE — Transfer of Care (Signed)
Immediate Anesthesia Transfer of Care Note  Patient: Jordan Gates  Procedure(s) Performed: RELEASE TRIGGER FINGER/A-1 PULLEY (Left )  Patient Location: PACU  Anesthesia Type:MAC and Regional  Level of Consciousness: awake, alert  and oriented  Airway & Oxygen Therapy: Patient Spontanous Breathing and Patient connected to face mask oxygen  Post-op Assessment: Report given to RN and Post -op Vital signs reviewed and stable  Post vital signs: Reviewed and stable  Last Vitals:  Vitals Value Taken Time  BP    Temp    Pulse    Resp    SpO2      Last Pain:  Vitals:   02/22/20 1143  TempSrc: Oral  PainSc: 0-No pain      Patients Stated Pain Goal: 2 (70/78/67 5449)  Complications: No complications documented.

## 2020-02-22 NOTE — Anesthesia Postprocedure Evaluation (Signed)
Anesthesia Post Note  Patient: Jordan Gates  Procedure(s) Performed: RELEASE TRIGGER FINGER/A-1 PULLEY (Left )     Patient location during evaluation: PACU Anesthesia Type: Bier Block Level of consciousness: awake and alert Pain management: pain level controlled Vital Signs Assessment: post-procedure vital signs reviewed and stable Respiratory status: spontaneous breathing, nonlabored ventilation and respiratory function stable Cardiovascular status: stable and blood pressure returned to baseline Anesthetic complications: no   No complications documented.  Last Vitals:  Vitals:   02/22/20 1400 02/22/20 1417  BP: 114/72 122/73  Pulse: 62 62  Resp: 12 18  Temp:  36.4 C  SpO2: 98% 100%    Last Pain:  Vitals:   02/22/20 1417  TempSrc:   PainSc: 0-No pain                 Beryle Lathe

## 2020-02-22 NOTE — Op Note (Signed)
02/22/2020 [facility] OPERATIVE REPORT   PREOPERATIVE DIAGNOSIS: Left trigger thumb.  POSTOPERATIVE DIAGNOSIS:  Left trigger thumb.  PROCEDURE: Left trigger thumb release.  SURGEON:  Betha Loa, MD  ASSISTANT:  None.  ANESTHESIA:  Bier block with sedation.  IV FLUIDS:  Per anesthesia flow sheet.  ESTIMATED BLOOD LOSS:  Minimal.  COMPLICATIONS:  None.  SPECIMENS:  None.  TOURNIQUET TIME:  Total Tourniquet Time Documented: Forearm (Left) - 18 minutes Total: Forearm (Left) - 18 minutes   DISPOSITION:  Stable to PACU.  LOCATION: [facility]  INDICATIONS: Jordan Gates is a 55 y.o. female with triggering left thumb.  This has been injected without lasting resolution.  She wishes to proceed with trigger release.  Risks, benefits and alternatives of surgery were discussed including the risk of blood loss, infection, damage to nerves, vessels, tendons, ligaments, bone, failure of surgery, need for additional surgery, complications with wound healing, continued pain, continued triggering and need for repeat surgery.  She voiced understanding of these risks and elected to proceed.  OPERATIVE COURSE:  After being identified preoperatively by myself, the patient and I agreed upon the procedure and site of procedure.  The surgical site was marked. Surgical consent had been signed. She was given preoperative antibiotic prophylaxis. She was transported to the operating room and placed on the operating room table in supine position with the Left upper extremity on an arm board. Bier block anesthesia was induced by the anesthesiologist.  The Left upper extremity was prepped and draped in normal sterile orthopedic fashion. A surgical pause was performed between surgeons, anesthesia, and operating room staff, and all were in agreement as to the patient, procedure, and site of procedure.  Tourniquet at the proximal aspect of the forearm had been inflated for the  Bier block.  An incision was made transversely at the MP flexion crease of the thumb.  This was made through the skin only.  Spreading technique was used.  The radial and ulnar digital nerves were identified and were protected throughout the case. The flexor sheath was identified.  The A1 pulley was identified.  It was sharply incised.  It was released in its entirety.  Care was taken to avoid any release of the oblique pulley. The thumb was placed through a range of motion, there was noted to be no catching.  The wound was copiously irrigated with sterile saline. It was then closed with 4-0 nylon in a horizontal mattress fashion.  The wound was injected with  0.25% plain Marcaine to aid in postoperative analgesia.  It was then dressed with sterile Xeroform, 4x4s, and wrapped lightly with a Coban dressing.  Tourniquet was deflated at 18 minutes.  The fingertips were pink with brisk capillary refill after deflation of the tourniquet.  The operative drapes were broken down and the patient was awoken from anesthesia safely.  She was transferred back to the stretcher and taken to the PACU in stable condition.   I will see her back in the office in 1 week for postoperative followup.  I will give her a prescription for Norco 5/325 1-2 tabs PO q6 hours prn pain, dispense # 15.    Betha Loa, MD Electronically signed, 02/22/20

## 2020-02-22 NOTE — Discharge Instructions (Addendum)

## 2020-02-22 NOTE — H&P (Signed)
Jordan Gates is an 55 y.o. female.   Chief Complaint: trigger thumb HPI: 55 yo female with triggering left thumb.  This has been injected without lasting resolution.  She wishes to proceed with trigger release.  Allergies:  Allergies  Allergen Reactions  . Sulfa Antibiotics Itching and Rash  . Penicillins   . Sulfa Antibiotics   . Penicillins Itching and Rash    Past Medical History:  Diagnosis Date  . GERD (gastroesophageal reflux disease)   . Hypertension   . N&V (nausea and vomiting)    n/v  epigastric pain    Past Surgical History:  Procedure Laterality Date  . CHOLECYSTECTOMY    . CHOLECYSTECTOMY  03/10/2011  . CHOLECYSTECTOMY  03/10/2011   Procedure: LAPAROSCOPIC CHOLECYSTECTOMY;  Surgeon: Mariella Saa, MD;  Location: [facility];  Service: General;  Laterality: N/A;  . TUBAL LIGATION    . WISDOM TOOTH EXTRACTION      Family History: Family History  Problem Relation Age of Onset  . Hypertension Mother   . Cancer Mother     Social History:   reports that she has never smoked. She has never used smokeless tobacco. She reports that she does not drink alcohol and does not use drugs.  Medications: Medications Prior to Admission  Medication Sig Dispense Refill  . cetirizine (ZYRTEC) 10 MG tablet Take 10 mg by mouth daily.    Marland Kitchen dexlansoprazole (DEXILANT) 60 MG capsule Take 60 mg by mouth daily.    . famotidine (PEPCID) 20 MG tablet Take 20 mg by mouth 2 (two) times daily.    . Multiple Vitamin (MULTIVITAMIN) tablet Take 1 tablet by mouth daily.    . valsartan (DIOVAN) 80 MG tablet Take 80 mg by mouth daily.      Results for orders placed or performed during the hospital encounter of 02/20/20 (from the past 48 hour(s))  SARS CORONAVIRUS 2 (TAT 6-24 HRS) Nasopharyngeal Nasopharyngeal Swab     Status: None   Collection Time: 02/20/20  2:49 PM   Specimen: Nasopharyngeal Swab  Result Value Ref Range   SARS Coronavirus 2 NEGATIVE NEGATIVE    Comment:  (NOTE) SARS-CoV-2 target nucleic acids are NOT DETECTED.  The SARS-CoV-2 RNA is generally detectable in upper and lower respiratory specimens during the acute phase of infection. Negative results do not preclude SARS-CoV-2 infection, do not rule out co-infections with other pathogens, and should not be used as the sole basis for treatment or other patient management decisions. Negative results must be combined with clinical observations, patient history, and epidemiological information. The expected result is Negative.  Fact Sheet for Patients: HairSlick.no  Fact Sheet for Healthcare Providers: quierodirigir.com  This test is not yet approved or cleared by the [place] FDA and  has been authorized for detection and/or diagnosis of SARS-CoV-2 by FDA under an Emergency Use Authorization (EUA). This EUA will remain  in effect (meaning this test can be used) for the duration of the COVID-19 declaration under Se ction 564(b)(1) of the Act, 21 U.S.C. section 360bbb-3(b)(1), unless the authorization is terminated or revoked sooner.  Performed at [facility], [address] [place], [place] [address]     No results found.   A comprehensive review of systems was negative.  Blood pressure 140/85, pulse 67, temperature 98.3 F (36.8 C), temperature source Oral, resp. rate 16, height 5\' 5"  (1.651 m), weight 94.9 kg, last menstrual period 02/14/2011, SpO2 100 %.  General appearance: alert, cooperative and appears stated age  Head: Normocephalic, without obvious abnormality, atraumatic Neck: supple, symmetrical, trachea midline Cardio: regular rate and rhythm Resp: clear to auscultation bilaterally Extremities: Intact sensation and capillary refill all digits.  +epl/fpl/io.  No wounds.  Pulses: 2+ and symmetric Skin: Skin color, texture, turgor normal. No rashes or lesions Neurologic: Grossly normal Incision/Wound:  none  Assessment/Plan Left thumb trigger digit.  Non operative and operative treatment options have been discussed with the patient and patient wishes to proceed with operative treatment. Risks, benefits, and alternatives of surgery have been discussed and the patient agrees with the plan of care.   Betha Loa 02/22/2020, 12:18 PM

## 2020-02-22 NOTE — Anesthesia Procedure Notes (Signed)
Anesthesia Regional Block: Bier block (IV Regional)   Pre-Anesthetic Checklist: ,, timeout performed, Correct Patient, Correct Site, Correct Laterality, Correct Procedure, Correct Position, site marked, Risks and benefits discussed,  Surgical consent,  Pre-op evaluation,  At surgeon's request and post-op pain management  Laterality: Left  Prep: alcohol swabs        Procedures:,,,,, intact distal pulses, Esmarch exsanguination, single tourniquet utilized, #20gu IV placed  Narrative:  CRNA: Karen Kitchens, CRNA

## 2020-02-22 NOTE — Anesthesia Preprocedure Evaluation (Addendum)
Anesthesia Evaluation  Patient identified by MRN, date of birth, ID band Patient awake    Reviewed: Allergy & Precautions, NPO status , Patient's Chart, lab work & pertinent test results  History of Anesthesia Complications Negative for: history of anesthetic complications  Airway Mallampati: II  TM Distance: >3 FB Neck ROM: Full    Dental  (+) Dental Advisory Given   Pulmonary neg pulmonary ROS,    Pulmonary exam normal        Cardiovascular hypertension, Pt. on medications Normal cardiovascular exam     Neuro/Psych negative neurological ROS  negative psych ROS   GI/Hepatic Neg liver ROS,  Cyclic N/V    Endo/Other  negative endocrine ROS  Renal/GU negative Renal ROS     Musculoskeletal negative musculoskeletal ROS (+)   Abdominal   Peds  Hematology negative hematology ROS (+)   Anesthesia Other Findings Covid test negative   Reproductive/Obstetrics  s/p tubal ligation                            Anesthesia Physical Anesthesia Plan  ASA: II  Anesthesia Plan: Bier Block and Bier Block-LIDOCAINE ONLY   Post-op Pain Management:    Induction: Intravenous  PONV Risk Score and Plan: 2 and Propofol infusion and Treatment may vary due to age or medical condition  Airway Management Planned: Natural Airway and Simple Face Mask  Additional Equipment: None  Intra-op Plan:   Post-operative Plan:   Informed Consent: I have reviewed the patients History and Physical, chart, labs and discussed the procedure including the risks, benefits and alternatives for the proposed anesthesia with the patient or authorized representative who has indicated his/her understanding and acceptance.       Plan Discussed with: CRNA and Anesthesiologist  Anesthesia Plan Comments:        Anesthesia Quick Evaluation

## 2020-02-25 ENCOUNTER — Encounter (HOSPITAL_BASED_OUTPATIENT_CLINIC_OR_DEPARTMENT_OTHER): Payer: Self-pay | Admitting: Orthopedic Surgery

## 2020-05-19 DIAGNOSIS — H40013 Open angle with borderline findings, low risk, bilateral: Secondary | ICD-10-CM | POA: Diagnosis not present

## 2020-06-06 DIAGNOSIS — J029 Acute pharyngitis, unspecified: Secondary | ICD-10-CM | POA: Diagnosis not present

## 2020-06-06 DIAGNOSIS — R058 Other specified cough: Secondary | ICD-10-CM | POA: Diagnosis not present

## 2020-10-27 DIAGNOSIS — I10 Essential (primary) hypertension: Secondary | ICD-10-CM | POA: Diagnosis not present

## 2020-10-27 DIAGNOSIS — L508 Other urticaria: Secondary | ICD-10-CM | POA: Diagnosis not present

## 2020-10-27 DIAGNOSIS — Z13220 Encounter for screening for lipoid disorders: Secondary | ICD-10-CM | POA: Diagnosis not present

## 2020-11-19 DIAGNOSIS — H40013 Open angle with borderline findings, low risk, bilateral: Secondary | ICD-10-CM | POA: Diagnosis not present

## 2021-03-11 DIAGNOSIS — Z1151 Encounter for screening for human papillomavirus (HPV): Secondary | ICD-10-CM | POA: Diagnosis not present

## 2021-03-11 DIAGNOSIS — Z1231 Encounter for screening mammogram for malignant neoplasm of breast: Secondary | ICD-10-CM | POA: Diagnosis not present

## 2021-03-11 DIAGNOSIS — Z6836 Body mass index (BMI) 36.0-36.9, adult: Secondary | ICD-10-CM | POA: Diagnosis not present

## 2021-03-11 DIAGNOSIS — Z01419 Encounter for gynecological examination (general) (routine) without abnormal findings: Secondary | ICD-10-CM | POA: Diagnosis not present

## 2021-03-11 DIAGNOSIS — Z124 Encounter for screening for malignant neoplasm of cervix: Secondary | ICD-10-CM | POA: Diagnosis not present

## 2021-04-27 DIAGNOSIS — L508 Other urticaria: Secondary | ICD-10-CM | POA: Diagnosis not present

## 2021-04-27 DIAGNOSIS — I10 Essential (primary) hypertension: Secondary | ICD-10-CM | POA: Diagnosis not present

## 2021-04-27 DIAGNOSIS — Z13220 Encounter for screening for lipoid disorders: Secondary | ICD-10-CM | POA: Diagnosis not present

## 2021-04-27 DIAGNOSIS — Z0000 Encounter for general adult medical examination without abnormal findings: Secondary | ICD-10-CM | POA: Diagnosis not present

## 2021-05-19 DIAGNOSIS — H40013 Open angle with borderline findings, low risk, bilateral: Secondary | ICD-10-CM | POA: Diagnosis not present

## 2022-03-22 DIAGNOSIS — Z6836 Body mass index (BMI) 36.0-36.9, adult: Secondary | ICD-10-CM | POA: Diagnosis not present

## 2022-03-22 DIAGNOSIS — Z1231 Encounter for screening mammogram for malignant neoplasm of breast: Secondary | ICD-10-CM | POA: Diagnosis not present

## 2022-03-22 DIAGNOSIS — Z01419 Encounter for gynecological examination (general) (routine) without abnormal findings: Secondary | ICD-10-CM | POA: Diagnosis not present

## 2022-03-30 DIAGNOSIS — F4322 Adjustment disorder with anxiety: Secondary | ICD-10-CM | POA: Diagnosis not present

## 2022-04-20 DIAGNOSIS — F4322 Adjustment disorder with anxiety: Secondary | ICD-10-CM | POA: Diagnosis not present

## 2022-04-20 DIAGNOSIS — R300 Dysuria: Secondary | ICD-10-CM | POA: Diagnosis not present

## 2022-04-20 DIAGNOSIS — N3000 Acute cystitis without hematuria: Secondary | ICD-10-CM | POA: Diagnosis not present

## 2022-04-29 DIAGNOSIS — E7800 Pure hypercholesterolemia, unspecified: Secondary | ICD-10-CM | POA: Diagnosis not present

## 2022-04-29 DIAGNOSIS — I10 Essential (primary) hypertension: Secondary | ICD-10-CM | POA: Diagnosis not present

## 2022-04-29 DIAGNOSIS — Z0000 Encounter for general adult medical examination without abnormal findings: Secondary | ICD-10-CM | POA: Diagnosis not present

## 2022-04-29 DIAGNOSIS — L508 Other urticaria: Secondary | ICD-10-CM | POA: Diagnosis not present

## 2022-08-19 DIAGNOSIS — H40013 Open angle with borderline findings, low risk, bilateral: Secondary | ICD-10-CM | POA: Diagnosis not present

## 2023-02-10 DIAGNOSIS — J988 Other specified respiratory disorders: Secondary | ICD-10-CM | POA: Diagnosis not present

## 2023-03-28 DIAGNOSIS — Z6836 Body mass index (BMI) 36.0-36.9, adult: Secondary | ICD-10-CM | POA: Diagnosis not present

## 2023-03-28 DIAGNOSIS — Z01419 Encounter for gynecological examination (general) (routine) without abnormal findings: Secondary | ICD-10-CM | POA: Diagnosis not present

## 2023-03-28 DIAGNOSIS — Z1151 Encounter for screening for human papillomavirus (HPV): Secondary | ICD-10-CM | POA: Diagnosis not present

## 2023-03-28 DIAGNOSIS — Z1231 Encounter for screening mammogram for malignant neoplasm of breast: Secondary | ICD-10-CM | POA: Diagnosis not present

## 2023-03-28 DIAGNOSIS — Z124 Encounter for screening for malignant neoplasm of cervix: Secondary | ICD-10-CM | POA: Diagnosis not present

## 2023-04-01 DIAGNOSIS — Z13820 Encounter for screening for osteoporosis: Secondary | ICD-10-CM | POA: Diagnosis not present

## 2023-05-03 DIAGNOSIS — Z0000 Encounter for general adult medical examination without abnormal findings: Secondary | ICD-10-CM | POA: Diagnosis not present

## 2023-05-03 DIAGNOSIS — I10 Essential (primary) hypertension: Secondary | ICD-10-CM | POA: Diagnosis not present

## 2023-05-03 DIAGNOSIS — E669 Obesity, unspecified: Secondary | ICD-10-CM | POA: Diagnosis not present

## 2023-05-03 DIAGNOSIS — L508 Other urticaria: Secondary | ICD-10-CM | POA: Diagnosis not present

## 2023-05-03 DIAGNOSIS — E7800 Pure hypercholesterolemia, unspecified: Secondary | ICD-10-CM | POA: Diagnosis not present

## 2023-05-23 DIAGNOSIS — E782 Mixed hyperlipidemia: Secondary | ICD-10-CM | POA: Diagnosis not present

## 2023-05-23 DIAGNOSIS — R0683 Snoring: Secondary | ICD-10-CM | POA: Diagnosis not present

## 2023-05-23 DIAGNOSIS — I10 Essential (primary) hypertension: Secondary | ICD-10-CM | POA: Diagnosis not present

## 2023-06-06 DIAGNOSIS — E782 Mixed hyperlipidemia: Secondary | ICD-10-CM | POA: Diagnosis not present

## 2023-06-06 DIAGNOSIS — I10 Essential (primary) hypertension: Secondary | ICD-10-CM | POA: Diagnosis not present

## 2023-06-06 DIAGNOSIS — R0683 Snoring: Secondary | ICD-10-CM | POA: Diagnosis not present

## 2023-06-20 DIAGNOSIS — R0683 Snoring: Secondary | ICD-10-CM | POA: Diagnosis not present

## 2023-06-20 DIAGNOSIS — E782 Mixed hyperlipidemia: Secondary | ICD-10-CM | POA: Diagnosis not present

## 2023-06-20 DIAGNOSIS — I10 Essential (primary) hypertension: Secondary | ICD-10-CM | POA: Diagnosis not present

## 2023-07-11 DIAGNOSIS — R0683 Snoring: Secondary | ICD-10-CM | POA: Diagnosis not present

## 2023-07-11 DIAGNOSIS — I10 Essential (primary) hypertension: Secondary | ICD-10-CM | POA: Diagnosis not present

## 2023-07-11 DIAGNOSIS — E782 Mixed hyperlipidemia: Secondary | ICD-10-CM | POA: Diagnosis not present

## 2023-07-26 DIAGNOSIS — L247 Irritant contact dermatitis due to plants, except food: Secondary | ICD-10-CM | POA: Diagnosis not present

## 2023-08-01 DIAGNOSIS — I10 Essential (primary) hypertension: Secondary | ICD-10-CM | POA: Diagnosis not present

## 2023-08-01 DIAGNOSIS — R0683 Snoring: Secondary | ICD-10-CM | POA: Diagnosis not present

## 2023-08-01 DIAGNOSIS — E782 Mixed hyperlipidemia: Secondary | ICD-10-CM | POA: Diagnosis not present

## 2023-08-29 DIAGNOSIS — R0683 Snoring: Secondary | ICD-10-CM | POA: Diagnosis not present

## 2023-08-29 DIAGNOSIS — E782 Mixed hyperlipidemia: Secondary | ICD-10-CM | POA: Diagnosis not present

## 2023-08-29 DIAGNOSIS — I10 Essential (primary) hypertension: Secondary | ICD-10-CM | POA: Diagnosis not present

## 2023-09-26 DIAGNOSIS — I10 Essential (primary) hypertension: Secondary | ICD-10-CM | POA: Diagnosis not present

## 2023-09-26 DIAGNOSIS — E782 Mixed hyperlipidemia: Secondary | ICD-10-CM | POA: Diagnosis not present

## 2023-09-26 DIAGNOSIS — R0683 Snoring: Secondary | ICD-10-CM | POA: Diagnosis not present

## 2023-12-14 DIAGNOSIS — R0683 Snoring: Secondary | ICD-10-CM | POA: Diagnosis not present

## 2023-12-14 DIAGNOSIS — I10 Essential (primary) hypertension: Secondary | ICD-10-CM | POA: Diagnosis not present

## 2023-12-14 DIAGNOSIS — E782 Mixed hyperlipidemia: Secondary | ICD-10-CM | POA: Diagnosis not present
